# Patient Record
Sex: Male | Born: 1980 | Race: White | Hispanic: No | Marital: Married | Smoking: Former smoker
Health system: Southern US, Community
[De-identification: ages and names within clinical notes are randomized; demographics above are authoritative.]

## PROBLEM LIST (undated history)

## (undated) DIAGNOSIS — L409 Psoriasis, unspecified: Secondary | ICD-10-CM

---

## 1999-03-06 ENCOUNTER — Emergency Department (HOSPITAL_COMMUNITY): Admission: EM | Admit: 1999-03-06 | Discharge: 1999-03-06 | Payer: Self-pay | Admitting: Emergency Medicine

## 1999-03-07 ENCOUNTER — Encounter: Payer: Self-pay | Admitting: *Deleted

## 1999-03-07 ENCOUNTER — Emergency Department (HOSPITAL_COMMUNITY): Admission: EM | Admit: 1999-03-07 | Discharge: 1999-03-07 | Payer: Self-pay | Admitting: Emergency Medicine

## 1999-03-15 ENCOUNTER — Emergency Department (HOSPITAL_COMMUNITY): Admission: EM | Admit: 1999-03-15 | Discharge: 1999-03-15 | Payer: Self-pay | Admitting: Emergency Medicine

## 1999-10-09 ENCOUNTER — Emergency Department (HOSPITAL_COMMUNITY): Admission: EM | Admit: 1999-10-09 | Discharge: 1999-10-09 | Payer: Self-pay | Admitting: Emergency Medicine

## 2011-07-10 ENCOUNTER — Emergency Department: Payer: Self-pay | Admitting: Emergency Medicine

## 2013-07-14 IMAGING — CT CT HEAD WITHOUT CONTRAST
3 series · 18 of 30 positions shown, 20 images · non-contrast
Comparison: none

REASON FOR EXAM: HA
COMMENTS:

[Series 2: without · axial · non-contrast · 0.40mm/px · z∈[-136,-22]mm · 8 of 31 slices shown (1 of 2)]
[im 4/31  brain]
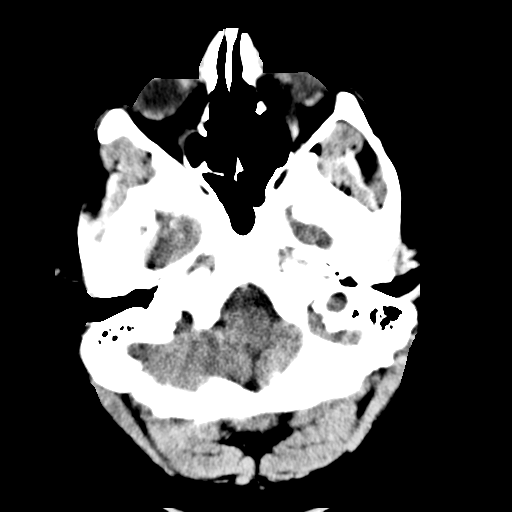
[im 7/31  brain]
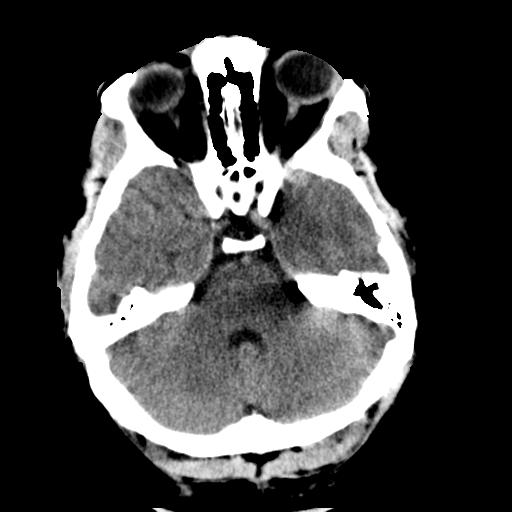
[im 11/31  brain]
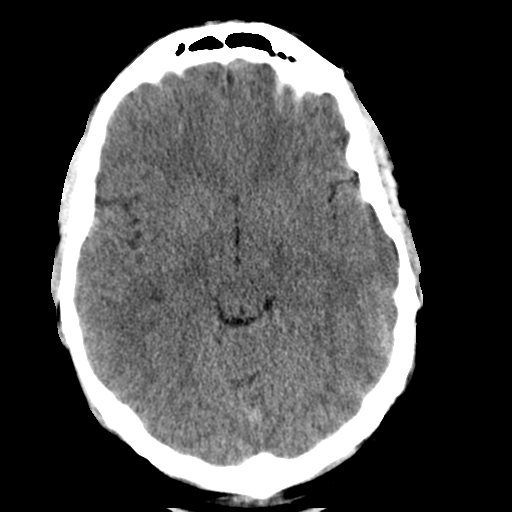
[im 14/31  brain]
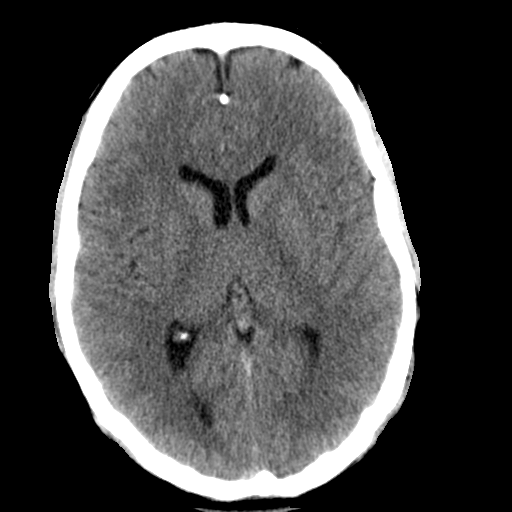
[im 17/31  brain]
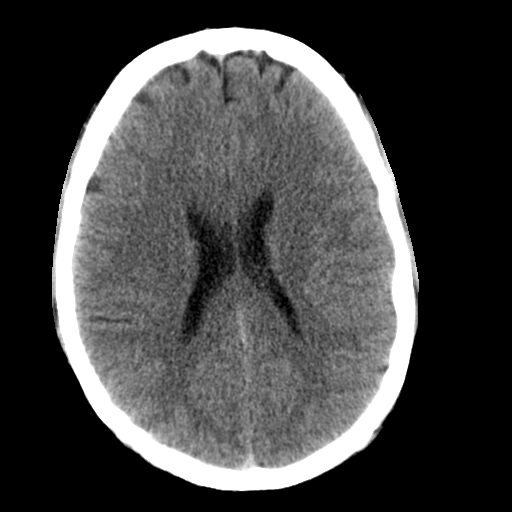
[im 21/31  brain]
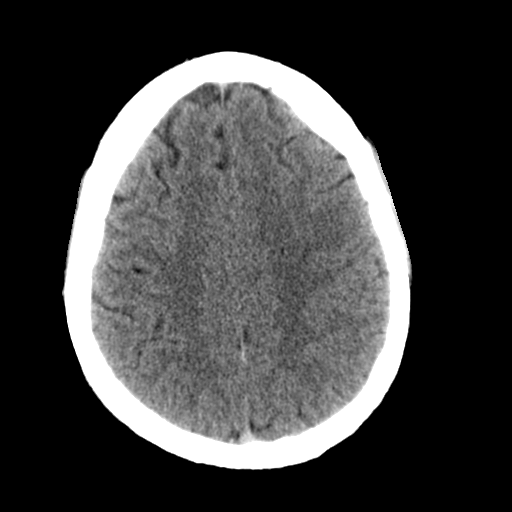
[im 24/31  brain]
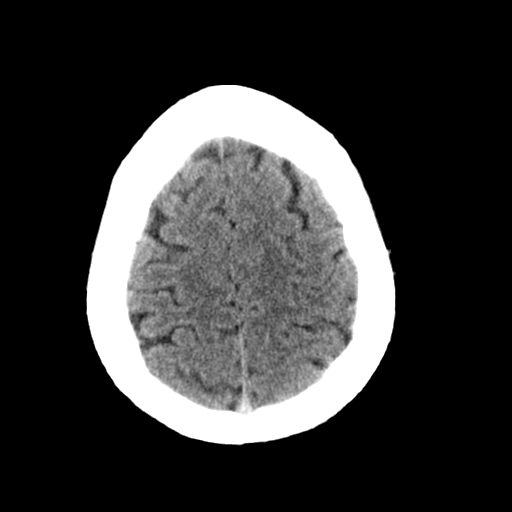
[im 27/31  brain]
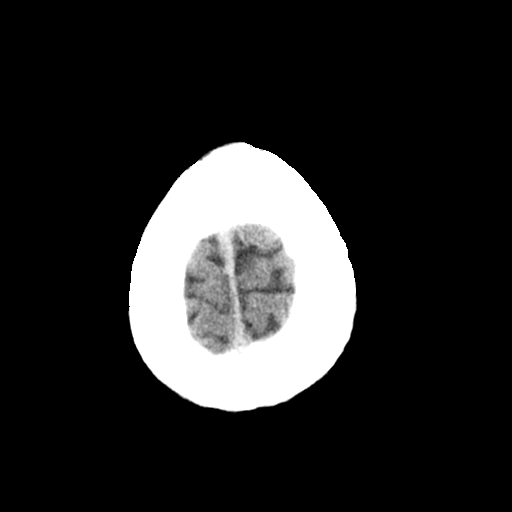

[Series 3: bone · axial · 0.40mm/px · z∈[-136,-122]mm · 2 of 31 slices shown]
[im 4/31  bone]
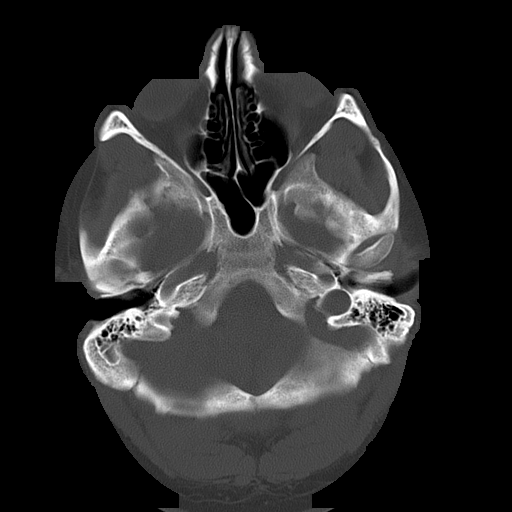
[im 7/31  bone]
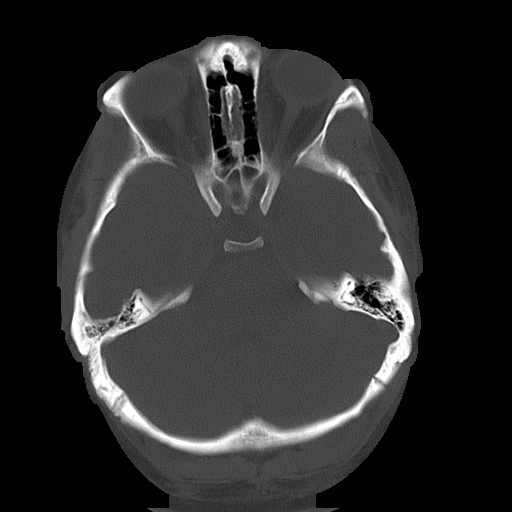

[Series 4: without · axial · non-contrast · 0.40mm/px · z∈[-161,-36]mm · 8 of 33 slices shown, 10 images (2 of 2)]
[im 4/33  brain]
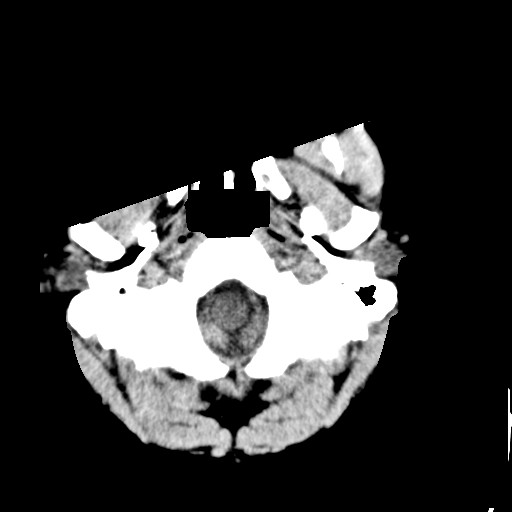
[im 4/33  bone]
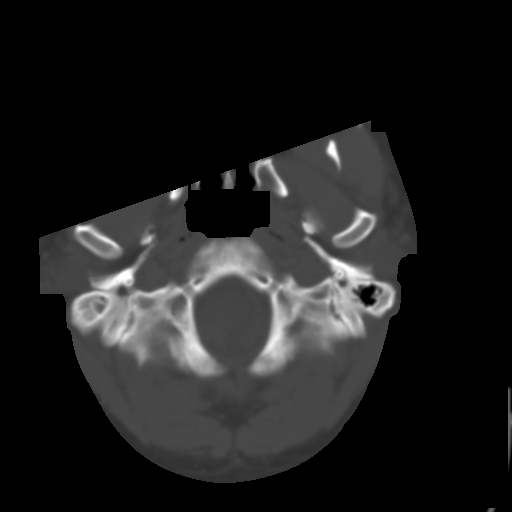
[im 8/33  brain]
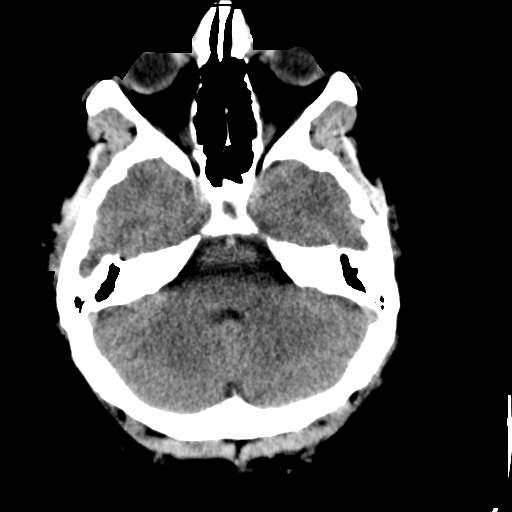
[im 11/33  brain]
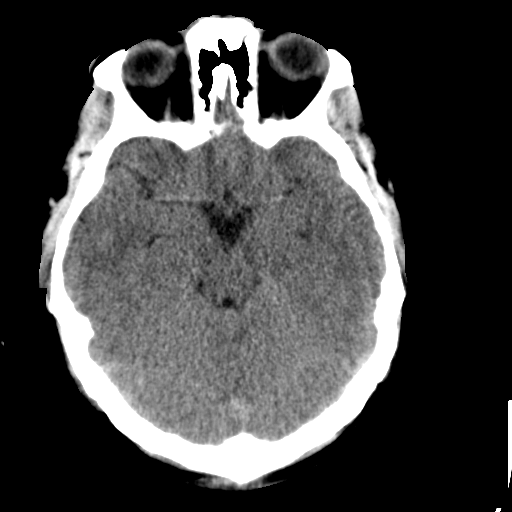
[im 15/33  brain]
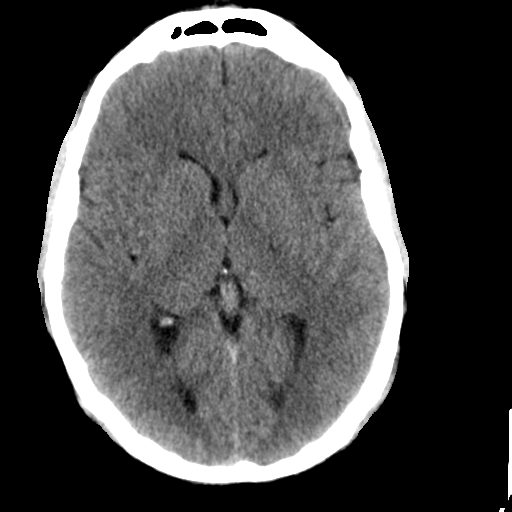
[im 18/33  brain]
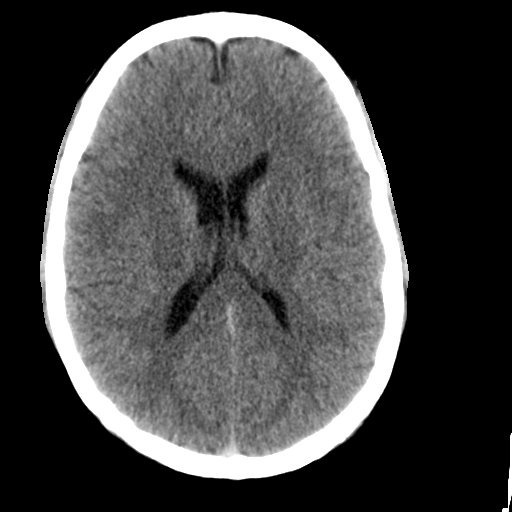
[im 18/33  bone]
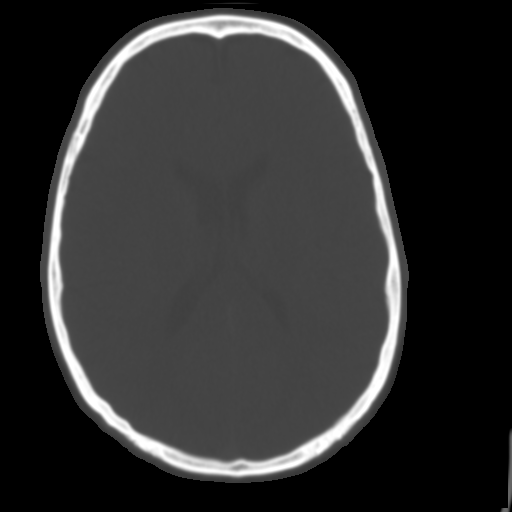
[im 22/33  brain]
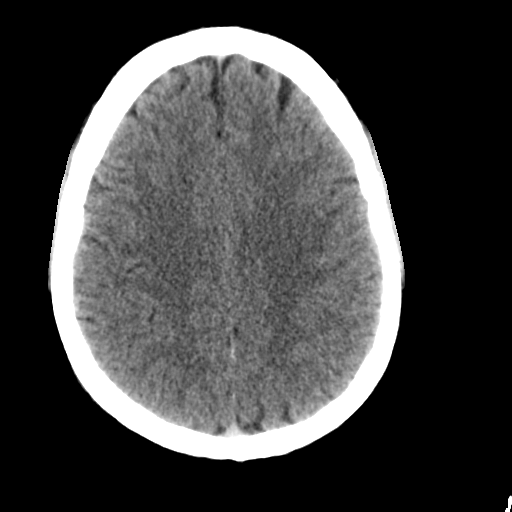
[im 25/33  brain]
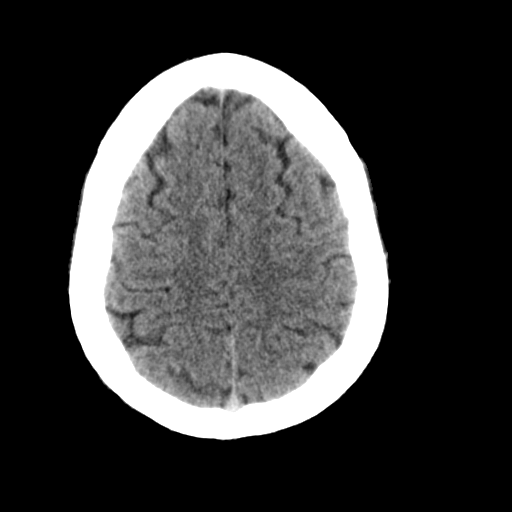
[im 29/33  brain]
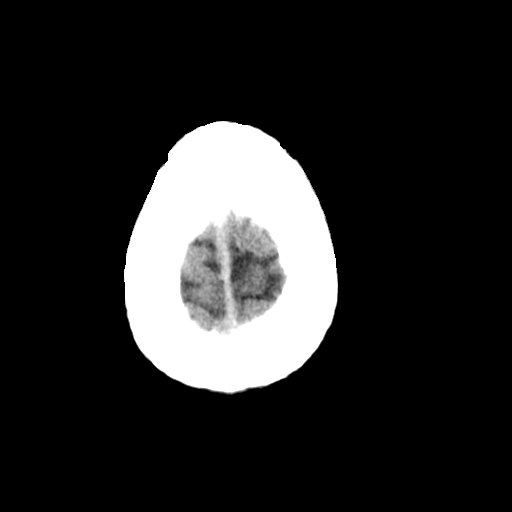

[18 of 30 positions shown; findings below may reference images not displayed]

PROCEDURE:     CT  - CT HEAD WITHOUT CONTRAST  - July 10, 2011  [DATE]

RESULT:     Axial noncontrast CT scanning was performed through the brain
with reconstructions at 5 mm intervals and slice thicknesses.

The ventricles are normal in size and position. There is no intracranial
hemorrhage nor intracranial mass effect. The  cerebellum and brainstem are
normal in density. There is no evidence of an evolving ischemic infarction.
At bone window settings there is no evidence of a skull fracture. The
observed portions of the paranasal sinuses and mastoid air cells are clear.
IMPRESSION: I see no acute intracranial abnormality.

## 2013-09-12 ENCOUNTER — Emergency Department: Payer: Self-pay | Admitting: Emergency Medicine

## 2013-09-12 LAB — URINALYSIS, COMPLETE
Bacteria: NONE SEEN
Bilirubin,UR: NEGATIVE
Blood: NEGATIVE
Glucose,UR: NEGATIVE mg/dL (ref 0–75)
Hyaline Cast: 6
Leukocyte Esterase: NEGATIVE
Nitrite: NEGATIVE
Ph: 6 (ref 4.5–8.0)
Protein: NEGATIVE
RBC,UR: <1 /HPF (ref 0–5)
Specific Gravity: 1.021 (ref 1.003–1.030)
Squamous Epithelial: NONE SEEN
WBC UR: 1 /HPF (ref 0–5)

## 2013-09-12 LAB — CBC WITH DIFFERENTIAL/PLATELET
Basophil #: 0 10*3/uL (ref 0.0–0.1)
Basophil %: 0.5 %
Eosinophil #: 0.1 10*3/uL (ref 0.0–0.7)
Eosinophil %: 1.7 %
HCT: 44 % (ref 40.0–52.0)
HGB: 15.2 g/dL (ref 13.0–18.0)
Lymphocyte #: 2.8 10*3/uL (ref 1.0–3.6)
Lymphocyte %: 34 %
MCH: 29.9 pg (ref 26.0–34.0)
MCHC: 34.5 g/dL (ref 32.0–36.0)
MCV: 87 fL (ref 80–100)
Monocyte #: 0.5 x10 3/mm (ref 0.2–1.0)
Monocyte %: 5.6 %
Neutrophil #: 4.8 10*3/uL (ref 1.4–6.5)
Neutrophil %: 58.2 %
Platelet: 118 10*3/uL — ABNORMAL LOW (ref 150–440)
RBC: 5.09 10*6/uL (ref 4.40–5.90)
RDW: 12.8 % (ref 11.5–14.5)
WBC: 8.3 10*3/uL (ref 3.8–10.6)

## 2013-09-12 LAB — COMPREHENSIVE METABOLIC PANEL
Albumin: 3.8 g/dL (ref 3.4–5.0)
Alkaline Phosphatase: 48 U/L
Anion Gap: 8 (ref 7–16)
BUN: 16 mg/dL (ref 7–18)
Bilirubin,Total: 0.6 mg/dL (ref 0.2–1.0)
Calcium, Total: 8.2 mg/dL — ABNORMAL LOW (ref 8.5–10.1)
Chloride: 108 mmol/L — ABNORMAL HIGH (ref 98–107)
Co2: 25 mmol/L (ref 21–32)
Creatinine: 1.39 mg/dL — ABNORMAL HIGH (ref 0.60–1.30)
EGFR (African American): >60
EGFR (Non-African Amer.): >60
Glucose: 122 mg/dL — ABNORMAL HIGH (ref 65–99)
Osmolality: 284 (ref 275–301)
Potassium: 3.2 mmol/L — ABNORMAL LOW (ref 3.5–5.1)
SGOT(AST): 31 U/L (ref 15–37)
SGPT (ALT): 31 U/L
Sodium: 141 mmol/L (ref 136–145)
Total Protein: 6.7 g/dL (ref 6.4–8.2)

## 2013-09-12 LAB — CK-MB: CK-MB: 1.6 ng/mL (ref 0.5–3.6)

## 2013-09-12 LAB — CK: CK, Total: 244 U/L

## 2013-09-12 LAB — MAGNESIUM: Magnesium: 1.6 mg/dL — ABNORMAL LOW

## 2013-09-12 LAB — TROPONIN I: Troponin-I: <0.02 ng/mL

## 2014-06-16 ENCOUNTER — Emergency Department
Admission: EM | Admit: 2014-06-16 | Discharge: 2014-06-16 | Disposition: A | Discharge status: Home / Self Care | Payer: Self-pay | Attending: Internal Medicine | Admitting: Internal Medicine

## 2014-06-16 ENCOUNTER — Encounter: Payer: Self-pay | Admitting: *Deleted

## 2014-06-16 DIAGNOSIS — L03115 Cellulitis of right lower limb: Secondary | ICD-10-CM | POA: Insufficient documentation

## 2014-06-16 DIAGNOSIS — L03119 Cellulitis of unspecified part of limb: Secondary | ICD-10-CM

## 2014-06-16 DIAGNOSIS — L02415 Cutaneous abscess of right lower limb: Secondary | ICD-10-CM | POA: Insufficient documentation

## 2014-06-16 DIAGNOSIS — L02416 Cutaneous abscess of left lower limb: Secondary | ICD-10-CM | POA: Insufficient documentation

## 2014-06-16 DIAGNOSIS — L03116 Cellulitis of left lower limb: Secondary | ICD-10-CM | POA: Insufficient documentation

## 2014-06-16 DIAGNOSIS — L02419 Cutaneous abscess of limb, unspecified: Secondary | ICD-10-CM

## 2014-06-16 DIAGNOSIS — Z720 Tobacco use: Secondary | ICD-10-CM | POA: Insufficient documentation

## 2014-06-16 DIAGNOSIS — L409 Psoriasis, unspecified: Secondary | ICD-10-CM | POA: Insufficient documentation

## 2014-06-16 HISTORY — DX: Psoriasis, unspecified: L40.9

## 2014-06-16 LAB — CBC WITH DIFFERENTIAL/PLATELET
Basophils Absolute: 0.1 10*3/uL (ref 0–0.1)
Basophils Relative: 1 %
Eosinophils Absolute: 0.2 10*3/uL (ref 0–0.7)
Eosinophils Relative: 2 %
HCT: 46.7 % (ref 40.0–52.0)
Hemoglobin: 16 g/dL (ref 13.0–18.0)
Lymphocytes Relative: 22 %
Lymphs Abs: 2.7 10*3/uL (ref 1.0–3.6)
MCH: 29.7 pg (ref 26.0–34.0)
MCHC: 34.3 g/dL (ref 32.0–36.0)
MCV: 86.5 fL (ref 80.0–100.0)
Monocytes Absolute: 1 10*3/uL (ref 0.2–1.0)
Monocytes Relative: 8 %
Neutro Abs: 8.2 10*3/uL — ABNORMAL HIGH (ref 1.4–6.5)
Neutrophils Relative %: 67 %
Platelets: 154 10*3/uL (ref 150–440)
RBC: 5.4 MIL/uL (ref 4.40–5.90)
RDW: 13 % (ref 11.5–14.5)
WBC: 12.3 10*3/uL — ABNORMAL HIGH (ref 3.8–10.6)

## 2014-06-16 LAB — BASIC METABOLIC PANEL
ANION GAP: 10 (ref 5–15)
BUN: 13 mg/dL (ref 6–20)
CALCIUM: 9.2 mg/dL (ref 8.9–10.3)
CHLORIDE: 103 mmol/L (ref 101–111)
CO2: 26 mmol/L (ref 22–32)
Creatinine, Ser: 1.04 mg/dL (ref 0.61–1.24)
GFR calc Af Amer: >60 mL/min (ref >60)
GFR calc non Af Amer: >60 mL/min (ref >60)
Glucose, Bld: 92 mg/dL (ref 65–99)
Potassium: 3.8 mmol/L (ref 3.5–5.1)
SODIUM: 139 mmol/L (ref 135–145)

## 2014-06-16 MED ORDER — PREDNISONE 10 MG (21) PO TBPK: 10.0000 mg | ORAL_TABLET | Freq: Every day | ORAL | Status: DC

## 2014-06-16 MED ORDER — CLOBETASOL PROPIONATE 0.05 % EX CREA: 1.0000 "application " | TOPICAL_CREAM | Freq: Two times a day (BID) | CUTANEOUS | Status: DC

## 2014-06-16 MED ORDER — DEXAMETHASONE SODIUM PHOSPHATE 10 MG/ML IJ SOLN
INTRAMUSCULAR | Status: AC
  Administered 2014-06-16: 10 mg via INTRAMUSCULAR
  Filled 2014-06-16: qty 1

## 2014-06-16 MED ORDER — SULFAMETHOXAZOLE-TRIMETHOPRIM 800-160 MG PO TABS: 1.0000 | ORAL_TABLET | Freq: Two times a day (BID) | ORAL | Status: DC

## 2014-06-16 MED ORDER — DEXAMETHASONE SODIUM PHOSPHATE 10 MG/ML IJ SOLN
10.0000 mg | Freq: Once | INTRAMUSCULAR | Status: AC
  Administered 2014-06-16: 10 mg via INTRAMUSCULAR

## 2014-06-16 NOTE — ED Provider Notes (Signed)
[facility] Emergency Department Provider Note ____________________________________________  Time seen: Approximately 5:55 PM  I have reviewed the triage vital signs and the nursing notes.   HISTORY  Chief Complaint Abscess    HPI Tyler Watts is a 34 y.o. male of bilateral leg sores for about 2 weeks. Patient stated he has a history of psoriasis has been refractory to Kenalog cream. Patient stated he started noticing redness with some lesions to the lower legs has increased the last 2 weeks. States the lesions of breaking out around the psoriasis plaques. Area is tender to palpation but there is no discharge. Patient rated the pain from his area 3/10. Patient denied any fever but states he notices a bowel movements on now loose.   Past Medical History  Diagnosis Date  . Psoriasis of scalp     There are no active problems to display for this patient.   No past surgical history on file.  Current Outpatient Rx  Name  Route  Sig  Dispense  Refill  . clobetasol cream (TEMOVATE) 0.05 %   Topical   Apply 1 application topically 2 (two) times daily.   30 g   0   . predniSONE (STERAPRED UNI-PAK 21 TAB) 10 MG (21) TBPK tablet   Oral   Take 1 tablet (10 mg total) by mouth daily. Taper dose for 6 days.   21 tablet   0   . sulfamethoxazole-trimethoprim (BACTRIM DS,SEPTRA DS) 800-160 MG per tablet   Oral   Take 1 tablet by mouth 2 (two) times daily.   20 tablet   0     Allergies Review of patient's allergies indicates no known allergies.  No family history on file.  Social History History  Substance Use Topics  . Smoking status: Current Every Day Smoker  . Smokeless tobacco: Not on file  . Alcohol Use: No    Review of Systems Constitutional: No fever/chills Eyes: No visual changes. ENT: No sore throat. Cardiovascular: Denies chest pain. Respiratory: Denies shortness of breath. Gastrointestinal: No abdominal pain.  No nausea, no  vomiting.  Patient stated without described as still loose diuretic but they're very loose.. Genitourinary: Negative for dysuria. Musculoskeletal: Negative for back pain. Skin: Diffuse arises patches with multiple erythematous papular lesions. Neurological: Negative for headaches, focal weakness or numbness.     10-point ROS otherwise negative.  ____________________________________________   PHYSICAL EXAM:  VITAL SIGNS: ED Triage Vitals  Enc Vitals Group     BP 06/16/14 1741 155/99 mmHg     Pulse Rate 06/16/14 1741 78     Resp --      Temp 06/16/14 1741 98.8 F (37.1 C)     Temp src --      SpO2 06/16/14 1741 96 %     Weight 06/16/14 1741 202 lb (91.627 kg)     Height 06/16/14 1741 5\' 6"  (1.676 m)     Head Cir --      Peak Flow --      Pain Score --      Pain Loc --      Pain Edu? --      Excl. in GC? --     Constitutional: Alert and oriented. Well appearing and in no acute distress. Appears anxious Eyes: Conjunctivae are normal. PERRL. EOMI. Head: Atraumatic. Nose: No congestion/rhinnorhea. Mouth/Throat: Mucous membranes are moist.  Oropharynx non-erythematous. Neck: No stridor.  Supple and nontender to palpation. Hematological/Lymphatic/Immunilogical: No cervical lymphadenopathy. Cardiovascular: Normal rate, regular rhythm. Grossly  normal heart sounds.  Good peripheral circulation. Respiratory: Normal respiratory effort.  No retractions. Lungs CTAB. Gastrointestinal: Soft and nontender. No distention. No abdominal bruits. No CVA tenderness. Musculoskeletal: No lower extremity tenderness nor edema.  No joint effusions. Neurologic:  Normal speech and language. No gross focal neurologic deficits are appreciated. Speech is normal. No gait instability. Skin:  Skin is warm, dry and intact. Dry scaly plaques with papular erythematous lesions. Psychiatric: Mood and affect are normal. Speech and behavior are normal.  ____________________________________________    LABS (all labs ordered are listed, but only abnormal results are displayed)  Labs Reviewed  CBC WITH DIFFERENTIAL/PLATELET - Abnormal; Notable for the following:    WBC 12.3 (*)    Neutro Abs 8.2 (*)    All other components within normal limits  BASIC METABOLIC PANEL   ____________________________________________  EKG   ____________________________________________  RADIOLOGY   ____________________________________________   PROCEDURES  Procedure(s) performed: None  Critical Care performed: No  ____________________________________________   INITIAL IMPRESSION / ASSESSMENT AND PLAN / ED COURSE  Pertinent labs & imaging results that were available during my care of the patient were reviewed by me and considered in my medical decision making (see chart for details).  Skin infection. ____________________________________________   FINAL CLINICAL IMPRESSION(S) / ED DIAGNOSES  Final diagnoses:  Cellulitis and abscess of leg  Psoriasis (a type of skin inflammation)       Joni Reiningonald K Smith, PA-C 06/16/14 1901  Sherlyn HaySheryl L Gottlieb, DO 06/16/14 2223

## 2014-06-16 NOTE — ED Notes (Signed)
   06/16/14 1835  Skin Color/Condition  Skin Color/Condition (WDL) X  Skin Integrity Rash (bilateral lower legs, red patchy dry skin noted with multiple areas of scabs)  Pt reports possible infection on bilateral lower legs. Pt has PMH that he reports seeing a dermatologist for same.

## 2014-06-16 NOTE — Discharge Instructions (Signed)
Take medications as directed and follow up with Dermatologist.

## 2017-07-15 ENCOUNTER — Emergency Department: Payer: Self-pay

## 2017-07-15 ENCOUNTER — Emergency Department
Admission: EM | Admit: 2017-07-15 | Discharge: 2017-07-16 | Disposition: A | Discharge status: Other Acute Inpatient Hospital | Payer: Self-pay | Attending: Emergency Medicine | Admitting: Emergency Medicine

## 2017-07-15 ENCOUNTER — Other Ambulatory Visit: Payer: Self-pay

## 2017-07-15 DIAGNOSIS — F17200 Nicotine dependence, unspecified, uncomplicated: Secondary | ICD-10-CM | POA: Insufficient documentation

## 2017-07-15 DIAGNOSIS — F29 Unspecified psychosis not due to a substance or known physiological condition: Secondary | ICD-10-CM | POA: Insufficient documentation

## 2017-07-15 DIAGNOSIS — Z046 Encounter for general psychiatric examination, requested by authority: Secondary | ICD-10-CM | POA: Insufficient documentation

## 2017-07-15 DIAGNOSIS — Z79899 Other long term (current) drug therapy: Secondary | ICD-10-CM | POA: Insufficient documentation

## 2017-07-15 LAB — COMPREHENSIVE METABOLIC PANEL
ALBUMIN: 4.8 g/dL (ref 3.5–5.0)
ALT: 20 U/L (ref 17–63)
AST: 20 U/L (ref 15–41)
Alkaline Phosphatase: 53 U/L (ref 38–126)
Anion gap: 10 (ref 5–15)
BUN: 12 mg/dL (ref 6–20)
CHLORIDE: 104 mmol/L (ref 101–111)
CO2: 23 mmol/L (ref 22–32)
Calcium: 9.3 mg/dL (ref 8.9–10.3)
Creatinine, Ser: 1 mg/dL (ref 0.61–1.24)
GFR calc Af Amer: >60 mL/min (ref >60)
GFR calc non Af Amer: >60 mL/min (ref >60)
GLUCOSE: 115 mg/dL — AB (ref 65–99)
POTASSIUM: 3.5 mmol/L (ref 3.5–5.1)
Sodium: 137 mmol/L (ref 135–145)
TOTAL PROTEIN: 7.8 g/dL (ref 6.5–8.1)
Total Bilirubin: 1.5 mg/dL — ABNORMAL HIGH (ref 0.3–1.2)

## 2017-07-15 LAB — ETHANOL

## 2017-07-15 LAB — CBC
HEMATOCRIT: 48.2 % (ref 40.0–52.0)
Hemoglobin: 16.7 g/dL (ref 13.0–18.0)
MCH: 29.2 pg (ref 26.0–34.0)
MCHC: 34.7 g/dL (ref 32.0–36.0)
MCV: 84.1 fL (ref 80.0–100.0)
Platelets: 195 10*3/uL (ref 150–440)
RBC: 5.73 MIL/uL (ref 4.40–5.90)
RDW: 13.3 % (ref 11.5–14.5)
WBC: 14.2 10*3/uL — ABNORMAL HIGH (ref 3.8–10.6)

## 2017-07-15 LAB — T4, FREE: FREE T4: 1.18 ng/dL (ref 0.82–1.77)

## 2017-07-15 LAB — GLUCOSE, CAPILLARY: Glucose-Capillary: 112 mg/dL — ABNORMAL HIGH (ref 65–99)

## 2017-07-15 LAB — CK: CK TOTAL: 98 U/L (ref 49–397)

## 2017-07-15 LAB — TSH: TSH: 2.969 u[IU]/mL (ref 0.350–4.500)

## 2017-07-15 NOTE — ED Triage Notes (Signed)
Pt presents to ED by EMS with altered mental status. Pt was at a church watching his sons 8th grade graduation and refused to leave when it was over because he wanted to be baptized. Pt family report he has been acting out of character for the past week. Pt father died due to brain tumor.

## 2017-07-15 NOTE — ED Provider Notes (Signed)
[facility] Emergency Department Provider Note   ____________________________________________   First MD Initiated Contact with Patient 07/15/17 2335     (approximate)  I have reviewed the triage vital signs and the nursing notes.   HISTORY  Chief Complaint Altered Mental Status  Level 5 caveat: Limited by bizarre behavior  HPI Tyler Watts is a 37 y.o. male brought to the ED by EMS from middle school graduation with a chief complaint of altered mental state.  Wife states patient has been acting erratic this week; quit his job of 20 years without any reason.  Tonight he was watching his son's eighth-grade graduation but refused to leave when it was over because he wanted to be baptized.  There was a clay structure on the wall and he thought that Jesus had moved it.  Patient had been in the ER lobby and received protocol labs and head scan but left.  His family member and the police brought him back.  Currently comes back to the treatment room in handcuffs.  Voices no medical complaints.  Denies SI/HI/AH/VH.  Denies recent fever, chills, chest pain, shortness of breath, abdominal pain, nausea, vomiting, diarrhea.  Denies recent travel or trauma.   Past Medical History:  Diagnosis Date  . Psoriasis of scalp     There are no active problems to display for this patient.   Prior to Admission medications   Medication Sig Start Date End Date Taking? Authorizing Provider  clobetasol cream (TEMOVATE) 0.05 % Apply 1 application topically 2 (two) times daily. 06/16/14   Joni Reining, PA-C  predniSONE (STERAPRED UNI-PAK 21 TAB) 10 MG (21) TBPK tablet Take 1 tablet (10 mg total) by mouth daily. Taper dose for 6 days. 06/16/14   Joni Reining, PA-C  sulfamethoxazole-trimethoprim (BACTRIM DS,SEPTRA DS) 800-160 MG per tablet Take 1 tablet by mouth 2 (two) times daily. 06/16/14   Joni Reining, PA-C    Allergies Patient has no known allergies.  No family history  on file.  Social History Social History   Tobacco Use  . Smoking status: Current Every Day Smoker  Substance Use Topics  . Alcohol use: No  . Drug use: Not on file    Review of Systems  Constitutional: No fever/chills Eyes: No visual changes. ENT: No sore throat. Cardiovascular: Denies chest pain. Respiratory: Denies shortness of breath. Gastrointestinal: No abdominal pain.  No nausea, no vomiting.  No diarrhea.  No constipation. Genitourinary: Negative for dysuria. Musculoskeletal: Negative for back pain. Skin: Negative for rash. Neurological: Negative for headaches, focal weakness or numbness. Psychiatric:Positive for erratic behavior.  ____________________________________________   PHYSICAL EXAM:  VITAL SIGNS: ED Triage Vitals [07/15/17 2159]  Enc Vitals Group     BP (!) 167/88     Pulse Rate 67     Resp 18     Temp 98.6 F (37 C)     Temp Source Oral     SpO2 95 %     Weight 205 lb (93 kg)     Height 5\' 6"  (1.676 m)     Head Circumference      Peak Flow      Pain Score 0     Pain Loc      Pain Edu?      Excl. in GC?     Constitutional: Alert and oriented.  Disheveled appearing and in no acute distress. Eyes: Conjunctivae are normal. PERRL. EOMI. Head: Atraumatic. Nose: No congestion/rhinnorhea. Mouth/Throat: Mucous membranes are moist.  Oropharynx non-erythematous. Neck: No stridor.  No cervical spine tenderness to palpation.  Supple neck without meningismus. Cardiovascular: Normal rate, regular rhythm. Grossly normal heart sounds.  Good peripheral circulation. Respiratory: Normal respiratory effort.  No retractions. Lungs CTAB. Gastrointestinal: Soft and nontender. No distention. No abdominal bruits. No CVA tenderness. Musculoskeletal: No lower extremity tenderness nor edema.  No joint effusions. Neurologic:  Normal speech and language. No gross focal neurologic deficits are appreciated. No gait instability. Skin:  Skin is warm, dry and intact.   Psoriatic plaques noted diffusely.   Psychiatric: Mood and affect are bizarre.  Smiling inappropriately.  Appears to be reacting to internal stimuli.  Speech and behavior are normal.  ____________________________________________   LABS (all labs ordered are listed, but only abnormal results are displayed)  Labs Reviewed  COMPREHENSIVE METABOLIC PANEL - Abnormal; Notable for the following components:      Result Value   Glucose, Bld 115 (*)    Total Bilirubin 1.5 (*)    All other components within normal limits  CBC - Abnormal; Notable for the following components:   WBC 14.2 (*)    All other components within normal limits  GLUCOSE, CAPILLARY - Abnormal; Notable for the following components:   Glucose-Capillary 112 (*)    All other components within normal limits  ACETAMINOPHEN LEVEL - Abnormal; Notable for the following components:   Acetaminophen (Tylenol), Serum <10 (*)    All other components within normal limits  ETHANOL  CK  TSH  T4, FREE  AMMONIA  LACTIC ACID, PLASMA  SALICYLATE LEVEL  URINALYSIS, COMPLETE (UACMP) WITH MICROSCOPIC  URINE DRUG SCREEN, QUALITATIVE ([facility] ONLY)  LACTIC ACID, PLASMA  GLUCOSE, CAPILLARY   ____________________________________________  EKG  ED ECG REPORT I, Lennin Osmond J, the attending physician, personally viewed and interpreted this ECG.   Date: 07/15/2017  EKG Time: 2205  Rate: 71  Rhythm: normal EKG, normal sinus rhythm  Axis: Normal  Intervals:none  ST&T Change: Nonspecific  ____________________________________________  RADIOLOGY  ED MD interpretation: No ICH  Official radiology report(s): Ct Head Wo Contrast  Result Date: 07/15/2017 CLINICAL DATA:  Altered mental status, change in behavior for 1 week. Family history of brain tumor. EXAM: CT HEAD WITHOUT CONTRAST TECHNIQUE: Contiguous axial images were obtained from the base of the skull through the vertex without intravenous contrast. COMPARISON:  CT HEAD July 10, 2011  FINDINGS: BRAIN: No intraparenchymal hemorrhage, mass effect nor midline shift. The ventricles and sulci are normal. No acute large vascular territory infarcts. No abnormal extra-axial fluid collections. Basal cisterns are patent. VASCULAR: Unremarkable. SKULL/SOFT TISSUES: No skull fracture. No significant soft tissue swelling. ORBITS/SINUSES: The included ocular globes and orbital contents are normal.The mastoid aircells and included paranasal sinuses are well-aerated. OTHER: None. IMPRESSION: Normal noncontrast CT HEAD. Electronically Signed   By: Awilda Metroourtnay  Bloomer M.D.   On: 07/15/2017 22:36    ____________________________________________   PROCEDURES  Procedure(s) performed: None  Procedures  Critical Care performed: No  ____________________________________________   INITIAL IMPRESSION / ASSESSMENT AND PLAN / ED COURSE  As part of my medical decision making, I reviewed the following data within the electronic MEDICAL RECORD NUMBER History obtained from family, Nursing notes reviewed and incorporated, Labs reviewed, EKG interpreted, Old chart reviewed, Radiograph reviewed, A consult was requested and obtained from this/these consultant(s) Psychiatry and Notes from prior ED visits   37 year old healthy male who presents with erratic behavior, escalating over the past week.  Differential diagnosis includes but is not limited to intracranial hemorrhage, CVA, infectious, metabolic,  psychiatric etiologies, etc.   Patient had protocol lab work and CT scan while awaiting treatment room.  I have personally reviewed these which are unremarkable.  I have added acetaminophen and salicylate.  Awaiting urine sample for urine drug screen.  Given that patient is a flight risk and has already left the premises once, will place him under involuntary commitment for his safety.  Will consult TTS and [facility] psychiatry to evaluate.  Clinical Course as of Jul 16 321  Tue Jul 16, 2017  0321 Patient was evaluated  by [facility] psychiatry who recommends inpatient hospitalization and maintaining IVC.  Recommendations for as needed Haldol and Ativan given.  Patient is currently medically cleared for psychiatric disposition.   [JS]    Clinical Course User Index [JS] Irean Hong, MD     ____________________________________________   FINAL CLINICAL IMPRESSION(S) / ED DIAGNOSES  Final diagnoses:  Psychosis, unspecified psychosis type [facility])     ED Discharge Orders    None       Note:  This document was prepared using Dragon voice recognition software and may include unintentional dictation errors.    Irean Hong, MD 07/16/17 [phone]
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
## 2017-07-16 ENCOUNTER — Inpatient Hospital Stay
Admission: AD | Admit: 2017-07-16 | Discharge: 2017-07-22 | DRG: 885 | Disposition: A | Discharge status: Home / Self Care | Payer: No Typology Code available for payment source | Attending: Psychiatry | Admitting: Psychiatry

## 2017-07-16 ENCOUNTER — Encounter: Payer: Self-pay | Admitting: Psychiatry

## 2017-07-16 ENCOUNTER — Other Ambulatory Visit: Payer: Self-pay

## 2017-07-16 DIAGNOSIS — F312 Bipolar disorder, current episode manic severe with psychotic features: Secondary | ICD-10-CM | POA: Diagnosis present

## 2017-07-16 DIAGNOSIS — L409 Psoriasis, unspecified: Secondary | ICD-10-CM | POA: Diagnosis present

## 2017-07-16 DIAGNOSIS — Z87891 Personal history of nicotine dependence: Secondary | ICD-10-CM | POA: Diagnosis not present

## 2017-07-16 DIAGNOSIS — F410 Panic disorder [episodic paroxysmal anxiety] without agoraphobia: Secondary | ICD-10-CM | POA: Diagnosis present

## 2017-07-16 LAB — URINE DRUG SCREEN, QUALITATIVE (ARMC ONLY)
Amphetamines, Ur Screen: NOT DETECTED
Barbiturates, Ur Screen: NOT DETECTED
Benzodiazepine, Ur Scrn: POSITIVE — AB
Cannabinoid 50 Ng, Ur ~~LOC~~: NOT DETECTED
Cocaine Metabolite,Ur ~~LOC~~: NOT DETECTED
MDMA (Ecstasy)Ur Screen: NOT DETECTED
Methadone Scn, Ur: NOT DETECTED
Opiate, Ur Screen: NOT DETECTED
Phencyclidine (PCP) Ur S: NOT DETECTED
Tricyclic, Ur Screen: NOT DETECTED

## 2017-07-16 LAB — LACTIC ACID, PLASMA: Lactic Acid, Venous: 0.9 mmol/L (ref 0.5–1.9)

## 2017-07-16 LAB — AMMONIA: AMMONIA: 32 umol/L (ref 9–35)

## 2017-07-16 LAB — URINALYSIS, COMPLETE (UACMP) WITH MICROSCOPIC
BACTERIA UA: NONE SEEN
Bilirubin Urine: NEGATIVE
Glucose, UA: NEGATIVE mg/dL
Hgb urine dipstick: NEGATIVE
KETONES UR: 20 mg/dL — AB
Leukocytes, UA: NEGATIVE
Nitrite: NEGATIVE
PH: 6 (ref 5.0–8.0)
Protein, ur: NEGATIVE mg/dL
Specific Gravity, Urine: 1.026 (ref 1.005–1.030)
Squamous Epithelial / LPF: NONE SEEN (ref 0–5)

## 2017-07-16 LAB — ACETAMINOPHEN LEVEL: Acetaminophen (Tylenol), Serum: <10 ug/mL — ABNORMAL LOW (ref 10–30)

## 2017-07-16 LAB — SALICYLATE LEVEL: Salicylate Lvl: <7 mg/dL (ref 2.8–30.0)

## 2017-07-16 MED ORDER — TRAZODONE HCL 100 MG PO TABS
100.0000 mg | ORAL_TABLET | Freq: Every day | ORAL | Status: DC
  Administered 2017-07-16 – 2017-07-17 (×2): 100 mg via ORAL
  Filled 2017-07-16 (×2): qty 1

## 2017-07-16 MED ORDER — RISPERIDONE 1 MG PO TABS
2.0000 mg | ORAL_TABLET | Freq: Two times a day (BID) | ORAL | Status: DC
  Administered 2017-07-16 – 2017-07-17 (×2): 2 mg via ORAL
  Filled 2017-07-16 (×2): qty 2

## 2017-07-16 MED ORDER — LORAZEPAM 2 MG PO TABS
2.0000 mg | ORAL_TABLET | Freq: Four times a day (QID) | ORAL | Status: DC | PRN
  Administered 2017-07-16: 2 mg via ORAL
  Filled 2017-07-16: qty 1

## 2017-07-16 MED ORDER — HALOPERIDOL 5 MG PO TABS
5.0000 mg | ORAL_TABLET | Freq: Four times a day (QID) | ORAL | Status: DC | PRN
  Administered 2017-07-16: 5 mg via ORAL
  Filled 2017-07-16: qty 1

## 2017-07-16 MED ORDER — ACETAMINOPHEN 325 MG PO TABS
650.0000 mg | ORAL_TABLET | Freq: Four times a day (QID) | ORAL | Status: DC | PRN
  Administered 2017-07-18: 650 mg via ORAL
  Filled 2017-07-16: qty 2

## 2017-07-16 MED ORDER — DIVALPROEX SODIUM 500 MG PO DR TAB
500.0000 mg | DELAYED_RELEASE_TABLET | Freq: Three times a day (TID) | ORAL | Status: DC
  Administered 2017-07-16 – 2017-07-19 (×9): 500 mg via ORAL
  Filled 2017-07-16 (×8): qty 1

## 2017-07-16 MED ORDER — MAGNESIUM HYDROXIDE 400 MG/5ML PO SUSP: 30.0000 mL | Freq: Every day | ORAL | Status: DC | PRN

## 2017-07-16 MED ORDER — ALUM & MAG HYDROXIDE-SIMETH 200-200-20 MG/5ML PO SUSP: 30.0000 mL | ORAL | Status: DC | PRN

## 2017-07-16 NOTE — ED Notes (Signed)
Pt took medication without difficulty. Pt asked about rash on torso, when pt was asked if he has hx of "psoriasis or eczema" pt states "I've been told it's psoriasis." Pt states he has had the rash for "many years."

## 2017-07-16 NOTE — ED Notes (Signed)
Pt has blotches hives all over his body. He states they have been there for awhile. On his legs are rive hive like blisters. Pt states they are itchy. Pt easily changed into maroon scrubs and allowed nurse to take vital signs.

## 2017-07-16 NOTE — ED Notes (Signed)
Patient having to be redirected by [place] police to stay in room. Patient requesting to leave. Officer explaining to patient that he has been IVC and is unable to leave at this time. Patient went back into room.

## 2017-07-16 NOTE — [facility] Suicide Risk Assessment (Signed)
[facility]   Nursing information obtained from:    Demographic factors:    Current Mental Status:    Loss Factors:    Historical Factors:    Risk Reduction Factors:     Total Time spent with patient: 1 hour Principal Problem: Bipolar I disorder, current or most recent episode manic, with psychotic features [facility](HCC) Diagnosis:   Patient Active Problem List   Diagnosis Date Noted  . Bipolar I disorder, current or most recent episode manic, with psychotic features (HCC) [F31.2] 07/16/2017    Priority: High   Subjective Data: psychotic break  Continued Clinical Symptoms:    The "Alcohol Use Disorders Identification Test", Guidelines for Use in Primary Care, Second Edition.  World Science writerHealth Organization [facility](WHO). Score between 0-7:  no or low risk or alcohol related problems. Score between 8-15:  moderate risk of alcohol related problems. Score between 16-19:  high risk of alcohol related problems. Score 20 or above:  warrants further diagnostic evaluation for alcohol dependence and treatment.   CLINICAL FACTORS:   Bipolar Disorder:   Mixed State Currently Psychotic   Musculoskeletal: Strength & Muscle Tone: within normal limits Gait & Station: normal Patient leans: N/A  Psychiatric Specialty Exam: Physical Exam  Nursing note and vitals reviewed. Psychiatric: His speech is normal. His mood appears anxious. His affect is inappropriate. He is actively hallucinating. Thought content is paranoid and delusional. Cognition and memory are normal. He expresses impulsivity.    Review of Systems  Neurological: Negative.   Psychiatric/Behavioral: Positive for hallucinations. The patient has insomnia.   All other systems reviewed and are negative.   There were no vitals taken for this visit.There is no height or weight on file to calculate BMI.  General Appearance: Casual  Eye Contact:  Good  Speech:  Clear and Coherent  Volume:  Normal  Mood:  Euphoric   Affect:  Congruent  Thought Process:  Disorganized, Irrelevant and Descriptions of Associations: Tangential  Orientation:  Full (Time, Place, and Person)  Thought Content:  Illogical, Delusions, Hallucinations: Auditory and Paranoid Ideation  Suicidal Thoughts:  No  Homicidal Thoughts:  No  Memory:  Immediate;   Fair Recent;   Fair Remote;   Fair  Judgement:  Poor  Insight:  Lacking  Psychomotor Activity:  Increased  Concentration:  Concentration: Fair and Attention Span: Fair  Recall:  [employer]  Fund of Knowledge:  Fair  Language:  Fair  Akathisia:  No  Handed:  Right  AIMS (if indicated):     Assets:  Communication Skills Desire for Improvement Financial Resources/Insurance Housing Physical Health Resilience Social Support Transportation Vocational/Educational  ADL's:  Intact  Cognition:  WNL  Sleep:         COGNITIVE FEATURES THAT CONTRIBUTE TO RISK:  None    SUICIDE RISK:   Minimal: No identifiable suicidal ideation.  Patients presenting with no risk factors but with morbid ruminations; may be classified as minimal risk based on the severity of the depressive symptoms  PLAN OF CARE: hospital admission, medication management, discharge planning.  Mr. Abbe AmsterdamHopkins is a 37 year old male with no past psychiatric history admitted for a manic episode.  #Psychosis -start Depakote 500 mg BID -start Risperdal 2 mg BID  #Insomnia -Trazodone 100 mg nightly  #Labs -lipid panel, TSH and A1C -EKG  #Disposition -discharge to home with family -follow up with [facility]  I certify that inpatient services furnished can reasonably be expected to improve the patient's condition.   Kristine LineaJolanta Greenly Rarick, MD 07/16/2017, 5:42  PM

## 2017-07-16 NOTE — ED Notes (Signed)
Only belongings are clothes. Wife took everything else home. 1 pair shoes, 1 pant, 1 belt, 1 underwear and 1 shirt

## 2017-07-16 NOTE — Tx Team (Signed)
Received Tyler Watts from the ED alert and oriented x4. He is cooperative with the admission  Process. He verbalized being at his son's graduation and refused to leave until he was baptized. He was removed with help and escorted to the hospital. He could not given a reason for his actions. The skin assessment was completed with nurse Isaiah BlakesJerrie. He was oriented to his new environment and dinner was ordered for him. He received relatives during visiting hours.

## 2017-07-16 NOTE — H&P (Signed)
Psychiatric Admission Assessment Adult  Patient Identification: Tyler Watts MRN:  [id number] Date of Evaluation:  07/16/2017 Chief Complaint:  Psychosis Principal Diagnosis: Bipolar I disorder, current or most recent episode manic, with psychotic features ([place]) Diagnosis:   Patient Active Problem List   Diagnosis Date Noted  . Bipolar I disorder, current or most recent episode manic, with psychotic features ([place]) [F31.2] 07/16/2017    Priority: High   History of Present Illness:   Identifying data. Mr. Tyler Watts is a 37 year old male with no past psychiatric history.  Chief complaint. "I have not been living up to the standards."  History of present illness. Information was obtained from the patient and the chart. The patient was brought to the ER by his uncle. The patient started behaving strangely for the past week or so. He became preoccupied with religion and started going to church. He quit his job of 20 years for no reason. Stopped sleeping. Felt very guilty about his "life style" but unable to point out what was wrong or improper. This all culminated during 8th grade graduation ceremony of his son on Sunday held in church when he refused to leave the church and insisted to be baptized. He also reports hearing voices, telling him what is inappropriate. He feels paranoid and has had two panic attacks. He denies alcohol or substance use but was positive for benzodiazepine on admission.  During the interview, the patient is attending to internal stimuli which he calls his "conscious". He is giggling inappropriately and his mood is too good for the situation. He has racing thoughts and difficulties answering questions. Speech in not loud or pressured.  Past psychiatric history. None.  Family psychiatric history. None.  Social history. Lives with his wife and 3 children ages 43, 82 and 57. Just quit a job at [employer]. The owner will take him back but the patient wants  change".  Total Time spent with patient: 1 hour  Is the patient at risk to self? No.  Has the patient been a risk to self in the past 6 months? No.  Has the patient been a risk to self within the distant past? No.  Is the patient a risk to others? No.  Has the patient been a risk to others in the past 6 months? No.  Has the patient been a risk to others within the distant past? No.   Prior Inpatient Therapy:   Prior Outpatient Therapy:    Alcohol Screening:   Substance Abuse History in the last 12 months:  No. Consequences of Substance Abuse: NA Previous Psychotropic Medications: No  Psychological Evaluations: No  Past Medical History:  Past Medical History:  Diagnosis Date  . Psoriasis of scalp    History reviewed. No pertinent surgical history. Family History: History reviewed. No pertinent family history.  Tobacco Screening:   Social History:  Social History   Substance and Sexual Activity  Alcohol Use No     Social History   Substance and Sexual Activity  Drug Use Not on file    Additional Social History:                           Allergies:  No Known Allergies Lab Results:  Results for orders placed or performed during the hospital encounter of 07/15/17 (from the past 48 hour(s))  Urine Drug Screen, Qualitative     Status: Abnormal   Collection Time: 07/15/17  2:54 PM  Result Value  Ref Range   Tricyclic, Ur Screen NONE DETECTED NONE DETECTED   Amphetamines, Ur Screen NONE DETECTED NONE DETECTED   MDMA (Ecstasy)Ur Screen NONE DETECTED NONE DETECTED   Cocaine Metabolite,Ur [place] NONE DETECTED NONE DETECTED   Opiate, Ur Screen NONE DETECTED NONE DETECTED   Phencyclidine (PCP) Ur S NONE DETECTED NONE DETECTED   Cannabinoid 50 Ng, Ur [place] NONE DETECTED NONE DETECTED   Barbiturates, Ur Screen NONE DETECTED NONE DETECTED   Benzodiazepine, Ur Scrn POSITIVE (A) NONE DETECTED   Methadone Scn, Ur NONE DETECTED NONE DETECTED    Comment: (NOTE) Tricyclics +  metabolites, urine    Cutoff 1000 ng/mL Amphetamines + metabolites, urine  Cutoff 1000 ng/mL MDMA (Ecstasy), urine              Cutoff 500 ng/mL Cocaine Metabolite, urine          Cutoff 300 ng/mL Opiate + metabolites, urine        Cutoff 300 ng/mL Phencyclidine (PCP), urine         Cutoff 25 ng/mL Cannabinoid, urine                 Cutoff 50 ng/mL Barbiturates + metabolites, urine  Cutoff 200 ng/mL Benzodiazepine, urine              Cutoff 200 ng/mL Methadone, urine                   Cutoff 300 ng/mL The urine drug screen provides only a preliminary, unconfirmed analytical test result and should not be used for non-medical purposes. Clinical consideration and professional judgment should be applied to any positive drug screen result due to possible interfering substances. A more specific alternate chemical method must be used in order to obtain a confirmed analytical result. Gas chromatography / mass spectrometry (GC/MS) is the preferred confirmat ory method. Performed at [facility], [address] [place], [place] [address]   Glucose, capillary     Status: Abnormal   Collection Time: 07/15/17 10:03 PM  Result Value Ref Range   Glucose-Capillary 112 (H) 65 - 99 mg/dL  Urinalysis, Complete w Microscopic     Status: Abnormal   Collection Time: 07/15/17 10:05 PM  Result Value Ref Range   Color, Urine YELLOW (A) YELLOW   APPearance CLEAR (A) CLEAR   Specific Gravity, Urine 1.026 1.005 - 1.030   pH 6.0 5.0 - 8.0   Glucose, UA NEGATIVE NEGATIVE mg/dL   Hgb urine dipstick NEGATIVE NEGATIVE   Bilirubin Urine NEGATIVE NEGATIVE   Ketones, ur 20 (A) NEGATIVE mg/dL   Protein, ur NEGATIVE NEGATIVE mg/dL   Nitrite NEGATIVE NEGATIVE   Leukocytes, UA NEGATIVE NEGATIVE   RBC / HPF 0-5 0 - 5 RBC/hpf   WBC, UA 0-5 0 - 5 WBC/hpf   Bacteria, UA NONE SEEN NONE SEEN   Squamous Epithelial / LPF NONE SEEN 0 - 5   Mucus PRESENT     Comment: Performed at [facility], [address] [place], [place] [address]  Comprehensive metabolic panel     Status: Abnormal   Collection Time: 07/15/17 10:06 PM  Result Value Ref Range   Sodium 137 135 - 145 mmol/L   Potassium 3.5 3.5 - 5.1 mmol/L   Chloride 104 101 - 111 mmol/L   CO2 23 22 - 32 mmol/L   Glucose, Bld 115 (H) 65 - 99 mg/dL   BUN 12 6 - 20 mg/dL   Creatinine, Ser 1.00 0.61 - 1.24  mg/dL   Calcium 9.3 8.9 - 10.3 mg/dL   Total Protein 7.8 6.5 - 8.1 g/dL   Albumin 4.8 3.5 - 5.0 g/dL   AST 20 15 - 41 U/L   ALT 20 17 - 63 U/L   Alkaline Phosphatase 53 38 - 126 U/L   Total Bilirubin 1.5 (H) 0.3 - 1.2 mg/dL   GFR calc non Af Amer >60 >60 mL/min   GFR calc Af Amer >60 >60 mL/min    Comment: (NOTE) The eGFR has been calculated using the CKD EPI equation. This calculation has not been validated in all clinical situations. eGFR's persistently <60 mL/min signify possible Chronic Kidney Disease.    Anion gap 10 5 - 15    Comment: Performed at [facility], [address] [place], [place] [address]  CBC     Status: Abnormal   Collection Time: 07/15/17 10:06 PM  Result Value Ref Range   WBC 14.2 (H) 3.8 - 10.6 K/uL   RBC 5.73 4.40 - 5.90 MIL/uL   Hemoglobin 16.7 13.0 - 18.0 g/dL   HCT 48.2 40.0 - 52.0 %   MCV 84.1 80.0 - 100.0 fL   MCH 29.2 26.0 - 34.0 pg   MCHC 34.7 32.0 - 36.0 g/dL   RDW 13.3 11.5 - 14.5 %   Platelets 195 150 - 440 K/uL    Comment: Performed at [facility], [address] [place], [place] [address]  Ethanol     Status: None   Collection Time: 07/15/17 10:06 PM  Result Value Ref Range   Alcohol, Ethyl (B) <10 <10 mg/dL    Comment: (NOTE) Lowest detectable limit for serum alcohol is 10 mg/dL. For medical purposes only. Performed at [facility], [address] [place], [place] [address]   CK     Status: None   Collection Time: 07/15/17 10:06 PM  Result Value Ref Range   Total CK 98 49 - 397 U/L    Comment: Performed at [facility],  [address] [place], [place] [address]  TSH     Status: None   Collection Time: 07/15/17 10:06 PM  Result Value Ref Range   TSH 2.969 0.350 - 4.500 uIU/mL    Comment: Performed by a 3rd Generation assay with a functional sensitivity of <=0.01 uIU/mL. Performed at [facility], [address] [place], [place] [address]   T4, free     Status: None   Collection Time: 07/15/17 10:06 PM  Result Value Ref Range   Free T4 1.18 0.82 - 1.77 ng/dL    Comment: (NOTE) Biotin ingestion may interfere with free T4 tests. If the results are inconsistent with the TSH level, previous test results, or the clinical presentation, then consider biotin interference. If needed, order repeat testing after stopping biotin. Performed at [facility], [address] [place], [place] [address]   Acetaminophen level     Status: Abnormal   Collection Time: 07/15/17 10:06 PM  Result Value Ref Range   Acetaminophen (Tylenol), Serum <10 (L) 10 - 30 ug/mL    Comment: (NOTE) Therapeutic concentrations vary significantly. A range of 10-30 ug/mL  may be an effective concentration for many patients. However, some  are best treated at concentrations outside of this range. Acetaminophen concentrations >150 ug/mL at 4 hours after ingestion  and >50 ug/mL at 12 hours after ingestion are often associated with  toxic reactions. Performed at [facility], [address], [place], [place] [address]  Salicylate level     Status: None   Collection Time: 07/15/17 10:06 PM  Result Value Ref Range   Salicylate Lvl <1.2 2.8 - 30.0 mg/dL    Comment: Performed at [facility], [address] [place], [place] [address]  Ammonia     Status: None   Collection Time: 07/16/17  1:17 AM  Result Value Ref Range   Ammonia 32 9 - 35 umol/L    Comment: Performed at [facility], [address] [place], [place] [address]  Lactic acid, plasma     Status: None   Collection Time:  07/16/17  1:17 AM  Result Value Ref Range   Lactic Acid, Venous 0.9 0.5 - 1.9 mmol/L    Comment: Performed at [facility], [address] [place], [place] [address]    Blood Alcohol level:  Lab Results  Component Value Date   [facility] <10 15/83/0940    Metabolic Disorder Labs:  No results found for: HGBA1C, MPG No results found for: PROLACTIN No results found for: CHOL, TRIG, HDL, CHOLHDL, VLDL, LDLCALC  Current Medications: Current Facility-Administered Medications  Medication Dose Route Frequency Provider Last Rate Last Dose  . acetaminophen (TYLENOL) tablet 650 mg  650 mg Oral Q6H PRN Carolyne Whitsel B, MD      . alum & mag hydroxide-simeth (MAALOX/MYLANTA) 200-200-20 MG/5ML suspension 30 mL  30 mL Oral Q4H PRN Mackey Varricchio B, MD      . divalproex (DEPAKOTE) DR tablet 500 mg  500 mg Oral Q8H Sopheap Basic B, MD      . magnesium hydroxide (MILK OF MAGNESIA) suspension 30 mL  30 mL Oral Daily PRN Muath Hallam B, MD      . risperiDONE (RISPERDAL) tablet 2 mg  2 mg Oral BID Geniece Akers B, MD      . traZODone (DESYREL) tablet 100 mg  100 mg Oral QHS Bayley Hurn B, MD       PTA Medications: Medications Prior to Admission  Medication Sig Dispense Refill Last Dose  . clobetasol cream (TEMOVATE) 7.68 % Apply 1 application topically 2 (two) times daily. (Patient not taking: Reported on 07/16/2017) 30 g 0 Not Taking at Unknown time  . predniSONE (STERAPRED UNI-PAK 21 TAB) 10 MG (21) TBPK tablet Take 1 tablet (10 mg total) by mouth daily. Taper dose for 6 days. (Patient not taking: Reported on 07/16/2017) 21 tablet 0 Completed Course at Unknown time  . sulfamethoxazole-trimethoprim (BACTRIM DS,SEPTRA DS) 800-160 MG per tablet Take 1 tablet by mouth 2 (two) times daily. (Patient not taking: Reported on 07/16/2017) 20 tablet 0 Completed Course at Unknown time    Musculoskeletal: Strength & Muscle Tone: within normal limits Gait & Station:  normal Patient leans: N/A  Psychiatric Specialty Exam: I reviewed physical examination performed in the ER and agree with the findings. Physical Exam  Nursing note and vitals reviewed. Psychiatric: His speech is normal. His affect is labile and inappropriate. He is actively hallucinating. Thought content is paranoid and delusional. Cognition and memory are impaired. He expresses impulsivity.    Review of Systems  Neurological: Negative.   Psychiatric/Behavioral: Positive for hallucinations. The patient has insomnia.   All other systems reviewed and are negative.   There were no vitals taken for this visit.There is no height or weight on file to calculate BMI.  See SRA  Sleep:       Treatment Plan Summary: Daily contact with patient to assess and evaluate symptoms and progress in treatment and Medication management   Mr. Riddles is a 37 year old male with no past psychiatric history admitted for a manic episode.  #Psychosis -start Depakote 500 mg BID -start Risperdal 2 mg BID  #Insomnia -Trazodone 100 mg nightly  #Labs -lipid panel, TSH and A1C -EKG  #Disposition -discharge to home with family -follow up with [facility]   Observation Level/Precautions:  15 minute checks  Laboratory:  CBC Chemistry Profile UDS UA  Psychotherapy:    Medications:    Consultations:    Discharge Concerns:    Estimated LOS:  Other:     Physician Treatment Plan for Primary Diagnosis: Bipolar I disorder, current or most recent episode manic, with psychotic features ([place]) Long Term Goal(s): Improvement in symptoms so as ready for discharge  Short Term Goals: Ability to identify changes in lifestyle to reduce recurrence of condition will improve, Ability to verbalize feelings will improve, Ability to disclose and discuss suicidal ideas, Ability to demonstrate self-control will improve, Ability to identify and develop effective  coping behaviors will improve, Ability to maintain clinical measurements within normal limits will improve, Compliance with prescribed medications will improve and Ability to identify triggers associated with substance abuse/mental health issues will improve  Physician Treatment Plan for Secondary Diagnosis: Principal Problem:   Bipolar I disorder, current or most recent episode manic, with psychotic features ([place])  Long Term Goal(s): NA  Short Term Goals: NA  I certify that inpatient services furnished can reasonably be expected to improve the patient's condition.    Orson Slick, MD 6/11/20195:49 PM

## 2017-07-16 NOTE — ED Notes (Signed)
Spoke to psychiatrist, she received report and is ready to speak to patient. He is sitting in interview room with security outside of room

## 2017-07-16 NOTE — ED Notes (Signed)
[facility]

## 2017-07-16 NOTE — ED Notes (Addendum)
MD Don PerkingVeronese speaking with patients wife at this time.

## 2017-07-16 NOTE — BH Assessment (Signed)
Writer attempted to assess, pt asleep at time

## 2017-07-16 NOTE — BH Assessment (Signed)
Patient [facility] by Dr. Jennet MaduroPucilowska.  Attending Physician will be Dr. Johnella MoloneyMcNew.   Patient has been [facility] to room 315[facility] Nurse Lillette BoxerGwen F.   ER staff is aware of the admission:  Misty StanleyLisa, ER Kathrynn SpeedSectary   Dr. Don PerkingVeronese, ER MD   Ethelene BrownsAnthony, Patient Access.

## 2017-07-16 NOTE — ED Notes (Signed)
Pt came out of room and motioned toward the EMS doors, pt notified to return to room by [employer] officer. Pt states "I just want some fresh air, I always get up around this time." Pt notified he is not able to go outside, pt started to ambulate to door however was redirected back to his room. Pt laid back in his bed with blanket to abd, hands visible.

## 2017-07-16 NOTE — ED Notes (Signed)
Urine specimen sent to lab

## 2017-07-16 NOTE — Tx Team (Signed)
Initial Treatment Plan 07/16/2017 6:50 PM Tyler ConverseAdam D Watts [id number]    PATIENT STRESSORS: Occupational concerns Other: life and sef stress   PATIENT STRENGTHS: Ability for insight Active sense of humor Average or above average intelligence Capable of independent living Communication skills Motivation for treatment/growth Physical Health Supportive family/friends Work skills   PATIENT IDENTIFIED PROBLEMS: Unexplained behavior over the past sevefral days                     DISCHARGE CRITERIA:  Ability to meet basic life and health needs Adequate post-discharge living arrangements Improved stabilization in mood, thinking, and/or behavior  PRELIMINARY DISCHARGE PLAN: Return to previous living arrangement Return to previous work or school arrangements  PATIENT/FAMILY INVOLVEMENT: This treatment plan has been presented to and reviewed with the patient, Tyler Conversedam D Tyler Watts.  The patient has been given the opportunity to ask questions and make suggestions.  Rex KrasJoanne  Travanti Mcmanus, RN 07/16/2017, 6:50 PM

## 2017-07-16 NOTE — ED Notes (Signed)
Pt came out of his room , demanding to go outside.  Getting in security's face and saying hes 'going to get some fresh air'.  Pt staring down officer.  After several minutes, pt was redirected back to his room.

## 2017-07-16 NOTE — ED Notes (Signed)
Pt came out of room, demanding to go outside for fresh air.  Pt continues to walk down hallway pulling away from officers headed toward EMS bay.  After several minutes, pt was redirected back to room.
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
## 2017-07-17 LAB — HEMOGLOBIN A1C
Hgb A1c MFr Bld: 4.9 % (ref 4.8–5.6)
MEAN PLASMA GLUCOSE: 93.93 mg/dL

## 2017-07-17 LAB — LIPID PANEL
CHOLESTEROL: 164 mg/dL (ref 0–200)
HDL: 36 mg/dL — ABNORMAL LOW (ref >40)
LDL Cholesterol: 109 mg/dL — ABNORMAL HIGH (ref 0–99)
Total CHOL/HDL Ratio: 4.6 RATIO
Triglycerides: 94 mg/dL (ref <150)
VLDL: 19 mg/dL (ref 0–40)

## 2017-07-17 MED ORDER — OLANZAPINE 5 MG PO TBDP
10.0000 mg | ORAL_TABLET | Freq: Every day | ORAL | Status: DC
  Administered 2017-07-17 – 2017-07-18 (×2): 10 mg via ORAL
  Filled 2017-07-17 (×2): qty 2

## 2017-07-17 MED ORDER — RISPERIDONE 1 MG PO TABS: 3.0000 mg | ORAL_TABLET | Freq: Every day | ORAL | Status: DC

## 2017-07-17 NOTE — Progress Notes (Addendum)
[facility] MD Progress Note  07/17/2017 11:20 AM Tyler Watts  MRN:  [id number]  Subjective:    Mr. Sliter met with treatment team. He is still hallucinating and presents with odd affect. He responds to question with a question, often quizzical or sarcastic. H e reports feeling "in the fog". Accepts medications. No somatic complaints. Started participating in programming.   Principal Problem: Bipolar I disorder, current or most recent episode manic, with psychotic features ([place]) Diagnosis:   Patient Active Problem List   Diagnosis Date Noted  . Bipolar I disorder, current or most recent episode manic, with psychotic features ([place]) [F31.2] 07/16/2017    Priority: High   Total Time spent with patient: 20 minutes  Past Psychiatric History: none  Past Medical History:  Past Medical History:  Diagnosis Date  . Psoriasis of scalp    History reviewed. No pertinent surgical history. Family History: History reviewed. No pertinent family history. Family Psychiatric  History: none Social History:  Social History   Substance and Sexual Activity  Alcohol Use No   Comment: 2 beers in a year     Social History   Substance and Sexual Activity  Drug Use Not Currently    Social History   Socioeconomic History  . Marital status: Married    Spouse name: Not on file  . Number of children: Not on file  . Years of education: Not on file  . Highest education level: Not on file  Occupational History  . Not on file  Social Needs  . Financial resource strain: Not on file  . Food insecurity:    Worry: Not on file    Inability: Not on file  . Transportation needs:    Medical: Not on file    Non-medical: Not on file  Tobacco Use  . Smoking status: Former Smoker    Last attempt to quit: 2017    Years since quitting: 2.4  . Smokeless tobacco: Never Used  Substance and Sexual Activity  . Alcohol use: No    Comment: 2 beers in a year  . Drug use: Not Currently  . Sexual activity: Yes   Lifestyle  . Physical activity:    Days per week: Not on file    Minutes per session: Not on file  . Stress: Not on file  Relationships  . Social connections:    Talks on phone: Not on file    Gets together: Not on file    Attends religious service: Not on file    Active member of club or organization: Not on file    Attends meetings of clubs or organizations: Not on file    Relationship status: Not on file  Other Topics Concern  . Not on file  Social History Narrative  . Not on file   Additional Social History:                         Sleep: Fair  Appetite:  Fair  Current Medications: Current Facility-Administered Medications  Medication Dose Route Frequency Provider Last Rate Last Dose  . acetaminophen (TYLENOL) tablet 650 mg  650 mg Oral Q6H PRN Arlee Santosuosso B, MD      . alum & mag hydroxide-simeth (MAALOX/MYLANTA) 200-200-20 MG/5ML suspension 30 mL  30 mL Oral Q4H PRN Mieshia Pepitone B, MD      . divalproex (DEPAKOTE) DR tablet 500 mg  500 mg Oral Q8H Carmella Kees B, MD   500 mg at 07/17/17  2353  . magnesium hydroxide (MILK OF MAGNESIA) suspension 30 mL  30 mL Oral Daily PRN Kariann Wecker B, MD      . risperiDONE (RISPERDAL) tablet 3 mg  3 mg Oral QHS Marquitta Persichetti B, MD      . traZODone (DESYREL) tablet 100 mg  100 mg Oral QHS Libby Goehring B, MD   100 mg at 07/16/17 2139    Lab Results:  Results for orders placed or performed during the hospital encounter of 07/16/17 (from the past 48 hour(s))  Hemoglobin A1c     Status: None   Collection Time: 07/17/17  6:58 AM  Result Value Ref Range   Hgb A1c MFr Bld 4.9 4.8 - 5.6 %    Comment: (NOTE) Pre diabetes:          5.7%-6.4% Diabetes:              >6.4% Glycemic control for   <7.0% adults with diabetes    Mean Plasma Glucose 93.93 mg/dL    Comment: Performed at [facility], [place] [address] [place], [place] [address]  Lipid panel     Status: Abnormal    Collection Time: 07/17/17  6:58 AM  Result Value Ref Range   Cholesterol 164 0 - 200 mg/dL   Triglycerides 94 <150 mg/dL   HDL 36 (L) >40 mg/dL   Total CHOL/HDL Ratio 4.6 RATIO   VLDL 19 0 - 40 mg/dL   LDL Cholesterol 109 (H) 0 - 99 mg/dL    Comment:        Total Cholesterol/HDL:CHD Risk Coronary Heart Disease Risk Table                     Men   Women  1/2 Average Risk   3.4   3.3  Average Risk       5.0   4.4  2 X Average Risk   9.6   7.1  3 X Average Risk  23.4   11.0        Use the calculated Patient Ratio above and the CHD Risk Table to determine the patient's CHD Risk.        ATP III CLASSIFICATION (LDL):  <100     mg/dL   Optimal  100-129  mg/dL   Near or Above                    Optimal  130-159  mg/dL   Borderline  160-189  mg/dL   High  >190     mg/dL   Very High Performed at [facility], [address] [place], [place] [address]     Blood Alcohol level:  Lab Results  Component Value Date   [facility] <10 86/76/1951    Metabolic Disorder Labs: Lab Results  Component Value Date   HGBA1C 4.9 07/17/2017   MPG 93.93 07/17/2017   No results found for: PROLACTIN Lab Results  Component Value Date   CHOL 164 07/17/2017   TRIG 94 07/17/2017   HDL 36 (L) 07/17/2017   CHOLHDL 4.6 07/17/2017   VLDL 19 07/17/2017   LDLCALC 109 (H) 07/17/2017    Physical Findings: AIMS:  , ,  ,  ,    CIWA:    COWS:     Musculoskeletal: Strength & Muscle Tone: within normal limits Gait & Station: normal Patient leans: N/A  Psychiatric Specialty Exam: Physical Exam  Nursing note and vitals reviewed. Psychiatric: His affect is inappropriate.  His speech is delayed and tangential. He is slowed and actively hallucinating. Thought content is delusional. Cognition and memory are normal. He expresses impulsivity.    Review of Systems  Neurological: Negative.   Psychiatric/Behavioral: Positive for hallucinations. The patient has insomnia.   All other systems reviewed  and are negative.   Blood pressure 131/76, pulse (!) 142, temperature 98 F (36.7 C), temperature source Oral, resp. rate 18, height 5' 5.75" (1.67 m), weight 93.4 kg (206 lb), SpO2 97 %.Body mass index is 33.5 kg/m.  General Appearance: Casual  Eye Contact:  Good  Speech:  Clear and Coherent  Volume:  Normal  Mood:  Irritable  Affect:  Congruent  Thought Process:  Goal Directed and Descriptions of Associations: Intact  Orientation:  Full (Time, Place, and Person)  Thought Content:  Delusions, Hallucinations: Auditory and Paranoid Ideation  Suicidal Thoughts:  No  Homicidal Thoughts:  No  Memory:  Immediate;   Fair Recent;   Fair Remote;   Fair  Judgement:  Poor  Insight:  Lacking  Psychomotor Activity:  Normal  Concentration:  Concentration: Fair and Attention Span: Fair  Recall:  [employer] of Knowledge:  Fair  Language:  Fair  Akathisia:  No  Handed:  Right  AIMS (if indicated):     Assets:  Communication Skills Desire for Improvement Housing Intimacy Physical Health Resilience Social Support  ADL's:  Intact  Cognition:  WNL  Sleep:  Number of Hours: 7     Treatment Plan Summary: Daily contact with patient to assess and evaluate symptoms and progress in treatment and Medication management   Mr. Mackowski is a 37 year old male with no past psychiatric history admitted for a manic episode.  #Psychosis -continue Depakote 500 mg TID -change Risperdal to 3 mg nightly only   #Insomnia -Trazodone 100 mg nightly  #Tachycardia -likely from Risperdal, his dose was adjusted  #Labs -lipid panel, TSH and A1C are unremarkable -EKG  #Disposition -discharge to home with family -follow up with [facility]    Orson Slick, MD 07/17/2017, 11:20 AM

## 2017-07-17 NOTE — Progress Notes (Signed)
Recreation Therapy Notes  INPATIENT RECREATION THERAPY ASSESSMENT  Patient Details Name: Tyler Watts MRN: [id number] DOB: 09/17/1980 Today's Date: 07/17/2017       Information Obtained From: Patient  Able to Participate in Assessment/Interview: Yes  Patient Presentation: Responsive  Reason for Admission (Per Patient): Other (Comments)(I was acting crazy)  Patient Stressors: Family  Coping Skills:   Other (Comment)(Lay in my Hammock)  Leisure Interests (2+):  Ashby DawesNature - Fishing(Go to the lake)  Frequency of Recreation/Participation: Monthly  Awareness of Community Resources:     WalgreenCommunity Resources:     Current Use:    If no, Barriers?:    Expressed Interest in State Street CorporationCommunity Resource Information:    [place]  Patient Main Form of Transportation: Set designerCar  Patient Strengths:  Child psychotherapistice,Helpf, I try to keep cool  Patient Identified Areas of Improvement:  communication  Patient Goal for Hospitalization:  To get better  Current SI (including self-harm):  No  Current HI:  No  Current AVH: No  Staff Intervention Plan: Group Attendance, Collaborate with Interdisciplinary Treatment Team  Consent to Intern Participation: N/A  Tyler Watts 07/17/2017, 1:21 PM
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
## 2017-07-17 NOTE — Plan of Care (Signed)
Patient verbalizes understanding of the general information that has been provided to him and has not voiced any further questions or concerns at this time. Patient denies SI/HI/AVH as well as any signs/symptoms of depression/anxiety at this time. Patient has the ability to identify the available resources that can help him meet his health care needs. Patient has been in compliance with his prescribed therapeutic regimen and has not voiced any further questions or concerns at this time. Patient remains safe on the unit at this time.  Problem: Education: Goal: Knowledge of [facility] Education information/materials will improve Outcome: Progressing Goal: Emotional status will improve Outcome: Progressing Goal: Mental status will improve Outcome: Progressing Goal: Verbalization of understanding the information provided will improve Outcome: Progressing   Problem: Health Behavior/Discharge Planning: Goal: Identification of resources available to assist in meeting health care needs will improve Outcome: Progressing Goal: Compliance with treatment plan for underlying cause of condition will improve Outcome: Progressing

## 2017-07-17 NOTE — Progress Notes (Signed)
D- Patient alert and oriented. Patient presents in a pleasant mood on assessment stating that he did not sleep too well last night because he tossed and turned throughout the night. Patient denies SI, HI, AVH, and pain at this time. Patient also denies any signs/symptoms of depression/anxiety. Patient didn't have a stated goal for today because "it's not a lot I can do in here".   A- Scheduled medications administered to patient, per MD orders. Support and encouragement provided.  Routine safety checks conducted every 15 minutes.  Patient informed to notify staff with problems or concerns.  R- No adverse drug reactions noted. Patient contracts for safety at this time. Patient compliant with medications and treatment plan. Patient receptive, calm, and cooperative. Patient interacts well with others on the unit.  Patient remains safe at this time.

## 2017-07-17 NOTE — [facility] Group Notes (Signed)
[facility] Group Notes:  (Nursing/MHT/Case Management/Adjunct)  Date:  07/17/2017  Time:  9:39 PM  Type of Therapy:  Group Therapy  Participation Level:  Active  Participation Quality:  Appropriate  Affect:  Appropriate  Cognitive:  Appropriate  Insight:  Appropriate  Engagement in Group:  Engaged  Modes of Intervention:  Discussion  Summary of Progress/Problems:  Tyler EkJanice Marie Kaylene Watts 07/17/2017, 9:39 PM

## 2017-07-17 NOTE — Progress Notes (Signed)
Recreation Therapy Notes  Date: 07/17/2017  Time: 9:30 am  Location: Craft Room  Behavioral response: Appropriate   Intervention Topic: Communication  Discussion/Intervention:  Group content today was focused on communication. The group defined communication and ways to communicate with others. Individuals stated reason why communication is important and some reasons to communicate with others. Patients expressed if they thought they were good at communicating with others and ways they could improve their communication skills. The group identified important parts of communication and some experiences they have had in the past with communication. The group participated in the intervention "What is that?", where they had a chance to test out their communication skills and identify ways to improve their communication techniques.   Clinical Observations/Feedback:  Patient came to group late due to unknown reasons. He participated in the intervention and was social with peers and staff during group.          Tyler Watts 07/17/2017 12:05 PM

## 2017-07-17 NOTE — Progress Notes (Signed)
A&Ox3 this morning, thoughts are logical, reasonable, speech cleared up, affect and mood bright and jovial.

## 2017-07-17 NOTE — Tx Team (Addendum)
Interdisciplinary Treatment and Diagnostic Plan Update  07/17/2017 Time of Session: 10:36am Murrell Conversedam D Sossamon MRN: [id number]  Principal Diagnosis: Bipolar I disorder, current or most recent episode manic, with psychotic features (HCC)  Secondary Diagnoses: Principal Problem:   Bipolar I disorder, current or most recent episode manic, with psychotic features (HCC)   Current Medications:  Current Facility-Administered Medications  Medication Dose Route Frequency Provider Last Rate Last Dose  . acetaminophen (TYLENOL) tablet 650 mg  650 mg Oral Q6H PRN Pucilowska, Jolanta B, MD      . alum & mag hydroxide-simeth (MAALOX/MYLANTA) 200-200-20 MG/5ML suspension 30 mL  30 mL Oral Q4H PRN Pucilowska, Jolanta B, MD      . divalproex (DEPAKOTE) DR tablet 500 mg  500 mg Oral Q8H Pucilowska, Jolanta B, MD   500 mg at 07/17/17 0612  . magnesium hydroxide (MILK OF MAGNESIA) suspension 30 mL  30 mL Oral Daily PRN Pucilowska, Jolanta B, MD      . risperiDONE (RISPERDAL) tablet 3 mg  3 mg Oral QHS Pucilowska, Jolanta B, MD      . traZODone (DESYREL) tablet 100 mg  100 mg Oral QHS Pucilowska, Jolanta B, MD   100 mg at 07/16/17 2139   PTA Medications: Medications Prior to Admission  Medication Sig Dispense Refill Last Dose  . clobetasol cream (TEMOVATE) 0.05 % Apply 1 application topically 2 (two) times daily. (Patient not taking: Reported on 07/16/2017) 30 g 0 Not Taking at Unknown time  . predniSONE (STERAPRED UNI-PAK 21 TAB) 10 MG (21) TBPK tablet Take 1 tablet (10 mg total) by mouth daily. Taper dose for 6 days. (Patient not taking: Reported on 07/16/2017) 21 tablet 0 Completed Course at Unknown time  . sulfamethoxazole-trimethoprim (BACTRIM DS,SEPTRA DS) 800-160 MG per tablet Take 1 tablet by mouth 2 (two) times daily. (Patient not taking: Reported on 07/16/2017) 20 tablet 0 Completed Course at Unknown time    Patient Stressors: Occupational concerns Other: life and sef stress  Patient Strengths: Ability  for insight Active sense of humor Average or above average intelligence Capable of independent living Communication skills Motivation for treatment/growth Physical Health Supportive family/friends Work skills  Treatment Modalities: Medication Management, Group therapy, Case management,  1 to 1 session with clinician, Psychoeducation, Recreational therapy.   Physician Treatment Plan for Primary Diagnosis: Bipolar I disorder, current or most recent episode manic, with psychotic features (HCC) Long Term Goal(s): Improvement in symptoms so as ready for discharge NA   Short Term Goals: Ability to identify changes in lifestyle to reduce recurrence of condition will improve Ability to verbalize feelings will improve Ability to disclose and discuss suicidal ideas Ability to demonstrate self-control will improve Ability to identify and develop effective coping behaviors will improve Ability to maintain clinical measurements within normal limits will improve Compliance with prescribed medications will improve Ability to identify triggers associated with substance abuse/mental health issues will improve NA  Medication Management: Evaluate patient's response, side effects, and tolerance of medication regimen.  Therapeutic Interventions: 1 to 1 sessions, Unit Group sessions and Medication administration.  Evaluation of Outcomes: Progressing  Physician Treatment Plan for Secondary Diagnosis: Principal Problem:   Bipolar I disorder, current or most recent episode manic, with psychotic features (HCC)  Long Term Goal(s): Improvement in symptoms so as ready for discharge NA   Short Term Goals: Ability to identify changes in lifestyle to reduce recurrence of condition will improve Ability to verbalize feelings will improve Ability to disclose and discuss suicidal ideas Ability to demonstrate self-control  will improve Ability to identify and develop effective coping behaviors will  improve Ability to maintain clinical measurements within normal limits will improve Compliance with prescribed medications will improve Ability to identify triggers associated with substance abuse/mental health issues will improve NA     Medication Management: Evaluate patient's response, side effects, and tolerance of medication regimen.  Therapeutic Interventions: 1 to 1 sessions, Unit Group sessions and Medication administration.  Evaluation of Outcomes: Progressing   RN Treatment Plan for Primary Diagnosis: Bipolar I disorder, current or most recent episode manic, with psychotic features (HCC) Long Term Goal(s): Knowledge of disease and therapeutic regimen to maintain health will improve  Short Term Goals: Ability to verbalize feelings will improve, Ability to identify and develop effective coping behaviors will improve and Compliance with prescribed medications will improve  Medication Management: RN will administer medications as ordered by provider, will assess and evaluate patient's response and provide education to patient for prescribed medication. RN will report any adverse and/or side effects to prescribing provider.  Therapeutic Interventions: 1 on 1 counseling sessions, Psychoeducation, Medication administration, Evaluate responses to treatment, Monitor vital signs and CBGs as ordered, Perform/monitor CIWA, COWS, AIMS and Fall Risk screenings as ordered, Perform wound care treatments as ordered.  Evaluation of Outcomes: Progressing   LCSW Treatment Plan for Primary Diagnosis: Bipolar I disorder, current or most recent episode manic, with psychotic features (HCC) Long Term Goal(s): Safe transition to appropriate next level of care at discharge, Engage patient in therapeutic group addressing interpersonal concerns.  Short Term Goals: Engage patient in aftercare planning with referrals and resources, Increase social support and Increase skills for wellness and  recovery  Therapeutic Interventions: Assess for all discharge needs, 1 to 1 time with Social worker, Explore available resources and support systems, Assess for adequacy in community support network, Educate family and significant other(s) on suicide prevention, Complete Psychosocial Assessment, Interpersonal group therapy.  Evaluation of Outcomes: Progressing   Progress in Treatment: Attending groups: No. Participating in groups: No. Taking medication as prescribed: Yes. Toleration medication: Yes. Family/Significant other contact made: No, will contact:  Wife [name] Patient understands diagnosis: Yes. Discussing patient identified problems/goals with staff: Yes. Medical problems stabilized or resolved: Yes. Denies suicidal/homicidal ideation: Yes. Issues/concerns per patient self-inventory: No. Other:  New problem(s) identified: No, Describe:  None  New Short Term/Long Term Goal(s): "To get well."  Patient Goals:  "To get well."  Discharge Plan or Barriers: To return home and follow up with outpatient treatment.  Reason for Continuation of Hospitalization: Medication stabilization  Estimated Length of Stay: 3-5 days  Recreational Therapy: Patient Stressors: Family Patient Goal: Patient will identify 3 positive coping skills strategies to use post d/c within 5 recreation therapy group sessions  Attendees: Patient: Tyler Watts 07/17/2017 11:29 AM  Physician: Dr. Jennet Maduro, MD 07/17/2017 11:29 AM  Nursing: Hulan Amato, RN 07/17/2017 11:29 AM  RN Care Manager: 07/17/2017 11:29 AM  Social Worker: Johny Shears, LCSWA 07/17/2017 11:29 AM  Recreational Therapist: Danella Deis. Dreama Saa, LRT 07/17/2017 11:29 AM  Other: Heidi Dach, LCSW 07/17/2017 11:29 AM  Other: Damian Leavell, Chaplin 07/17/2017 11:29 AM  Other: Huey Romans, LCSW 07/17/2017 11:29 AM    Scribe for Treatment Team: Johny Shears, LCSW 07/17/2017 11:29 AM

## 2017-07-17 NOTE — Progress Notes (Signed)
Patient ID: Tyler Watts, male   DOB: 09/08/1980, 37 y.o.   MRN: [id number] Disorganized, crawled up in bed in consolation, confused, disoriented. "I feel like Sh-t.." unable to clarify what he means, speech clear but illogical with poor contents; patient unable to participate in a constructive conversation at this time, medications given at the bedside.

## 2017-07-17 NOTE — [facility] Suicide Risk Assessment (Signed)
[facility] INPATIENT:  Family/Significant Other Suicide Prevention Education  Suicide Prevention Education:  Education Completed; [name], Wife, [phone] has been identified by the patient as the family member/significant other with whom the patient will be residing, and identified as the person(s) who will aid the patient in the event of a mental health crisis (suicidal ideations/suicide attempt).  With written consent from the patient, the family member/significant other has been provided the following suicide prevention education, prior to the and/or following the discharge of the patient.  The suicide prevention education provided includes the following:  Suicide risk factors  Suicide prevention and interventions  National Suicide Hotline telephone number  [facility] assessment telephone number  [facility] Assistance 911  [facility] and/or [facility] telephone number  Request made of family/significant other to:  Remove weapons (e.g., guns, rifles, knives), all items previously/currently identified as safety concern.    Remove drugs/medications (over-the-counter, prescriptions, illicit drugs), all items previously/currently identified as a safety concern.  The family member/significant other verbalizes understanding of the suicide prevention education information provided.  The family member/significant other agrees to remove the items of safety concern listed above.  CSW spoke with the patients regarding the precipitating factors that led to his admission. She described behaviors that he was happening such as him being hyper-religious. She state that his is the rock of the family and that he has never had any of these behaviors before. The wife reports that he has been putting a lot of stress on himself lately. She says that before he came in he wasn't eating or sleeping. Wife reports that the patients mother is wanting to come  see him and she says that the patient told her that he doesn't want to see her until he is back home but will not tell her that. The wife has concerns with the mother coming to visit because she she believes that nothing is wrong with him and adds stress on to him which may cause him decline. Wife also reports that all guns/weapons will be removed from the home before the patient is discharged home. She reports that the patient can return home once he is stabilized.  Tyler ShearsCassandra  Tyler Watts 07/17/2017, 1:30 PM

## 2017-07-17 NOTE — [facility] Group Notes (Signed)
LCSW Group Therapy Note  07/17/2017 1:00 pm  Type of Therapy/Topic:  Group Therapy:  Emotion Regulation  Participation Level:  Did Not Attend   Description of Group:    The purpose of this group is to assist patients in learning to regulate negative emotions and experience positive emotions. Patients will be guided to discuss ways in which they have been vulnerable to their negative emotions. These vulnerabilities will be juxtaposed with experiences of positive emotions or situations, and patients will be challenged to use positive emotions to combat negative ones. Special emphasis will be placed on coping with negative emotions in conflict situations, and patients will process healthy conflict resolution skills.  Therapeutic Goals: 1. Patient will identify two positive emotions or experiences to reflect on in order to balance out negative emotions 2. Patient will label two or more emotions that they find the most difficult to experience 3. Patient will demonstrate positive conflict resolution skills through discussion and/or role plays  Summary of Patient Progress:   Tyler Watts was invited to today's group, but chose not to attend.    Therapeutic Modalities:   Cognitive Behavioral Therapy Feelings Identification Dialectical Behavioral Therapy

## 2017-07-17 NOTE — Plan of Care (Signed)
Patient slept for Estimated Hours of 7; Precautionary checks every 15 minutes for safety maintained, room free of safety hazards, patient sustains no injury or falls during this shift.  Problem: Education: Goal: Emotional status will improve Outcome: Not Progressing Goal: Mental status will improve Outcome: Not Progressing Goal: Verbalization of understanding the information provided will improve Outcome: Not Progressing

## 2017-07-18 MED ORDER — TRAZODONE HCL 100 MG PO TABS: 100.0000 mg | ORAL_TABLET | Freq: Every evening | ORAL | Status: DC | PRN

## 2017-07-18 NOTE — Plan of Care (Signed)
Improving and doing much better in affect and mood, thoughts are logical, articulate, listens actively but still have poor insight to his disease process. Per his wife's request to see me, I had a one-to-one of my presence with Tyler Watts, patient's wife; worried, "I have never seeing him like this prior to his admission, I want him to get well, please don't just send him home to us until I can be assured by myself that he is ready..." Clinical and moral support provided to patient and family.  Patient slept for Estimated Hours of 7.30; Precautionary checks every 15 minutes for safety maintained, room free of safety hazards, patient sustains no injury or falls during this shift.  Problem: Education: Goal: Emotional status will improve Outcome: Progressing Goal: Mental status will improve Outcome: Progressing Goal: Verbalization of understanding the information provided will improve Outcome: Progressing   Problem: Health Behavior/Discharge Planning: Goal: Compliance with treatment plan for underlying cause of condition will improve Outcome: Progressing

## 2017-07-18 NOTE — Plan of Care (Signed)
Patient verbalizes understanding of the general information that has been provided to him and has not voiced any further questions or concerns at this time. Patient denies SI/HI/AVH as well as any signs/symptoms of anxiety at this time. Patient has the ability to identify the available resources that can help him meet his health care needs. Patient has been in compliance with his prescribed therapeutic regimen and all questions/concerns have been addressed and answered at this time. Patient remains safe on the unit at this time.  Problem: Education: Goal: Knowledge of [facility] Education information/materials will improve Outcome: Progressing Goal: Emotional status will improve Outcome: Progressing Goal: Mental status will improve Outcome: Progressing Goal: Verbalization of understanding the information provided will improve Outcome: Progressing   Problem: Health Behavior/Discharge Planning: Goal: Identification of resources available to assist in meeting health care needs will improve Outcome: Progressing Goal: Compliance with treatment plan for underlying cause of condition will improve Outcome: Progressing

## 2017-07-18 NOTE — [facility] Group Notes (Signed)
  07/18/2017  Time: 1PM  Type of Therapy/Topic:  Group Therapy:  Balance in Life  Participation Level:  Minimal  Description of Group:   This group will address the concept of balance and how it feels and looks when one is unbalanced. Patients will be encouraged to process areas in their lives that are out of balance and identify reasons for remaining unbalanced. Facilitators will guide patients in utilizing problem-solving interventions to address and correct the stressor making their life unbalanced. Understanding and applying boundaries will be explored and addressed for obtaining and maintaining a balanced life. Patients will be encouraged to explore ways to assertively make their unbalanced needs known to significant others in their lives, using other group members and facilitator for support and feedback.  Therapeutic Goals: 1. Patient will identify two or more emotions or situations they have that consume much of in their lives. 2. Patient will identify signs/triggers that life has become out of balance:  3. Patient will identify two ways to set boundaries in order to achieve balance in their lives:  4. Patient will demonstrate ability to communicate their needs through discussion and/or role plays  Summary of Patient Progress: Pt continues to work towards their tx goals but has not yet reached them. Pt was able to appropriately participate in group discussion, and was able to offer support/validation to other group members. Pt reported he would like to spend more time on family and less time on work.    Therapeutic Modalities:   Cognitive Behavioral Therapy Solution-Focused Therapy Assertiveness Training  Heidi DachKelsey Raywood Wailes, MSW, LCSW Clinical Social Worker 07/18/2017 2:04 PM

## 2017-07-18 NOTE — Progress Notes (Signed)
D- Patient alert and oriented. Patient presents in a pleasant mood on assessment stating that he slept "like a rock" last night and did not voice any major complaints to this Clinical research associatewriter. Patient denies SI, HI, AVH, and pain at this time. Patient states to this writer that he is depressed because "I don't like being inside and I'm tired". Patient also denies any signs/symptoms of anxiety at this time. Patient's goal for today is to "stay positive".  A- Scheduled medications administered to patient, per MD orders. Support and encouragement provided.  Routine safety checks conducted every 15 minutes.  Patient informed to notify staff with problems or concerns.  R- No adverse drug reactions noted. Patient contracts for safety at this time. Patient compliant with medications and treatment plan. Patient receptive, calm, and cooperative. Patient interacts well with others on the unit.  Patient remains safe at this time.

## 2017-07-18 NOTE — Progress Notes (Signed)
[facility] MD Progress Note  07/18/2017 6:03 PM Tyler Watts  MRN:  [id number]  Subjective:    Tyler Watts reports feeling better and ready for discharge but he is still delusional and religiously preoccupied. He is still unreasonable and does not want to return to his job. He denies any symptoms and feels ready for discharge. He has no insight   Spoke with the wife who confirms that this is absolutely out of character behavior. She has known him for twenty years as a responsible, sensitive and levelheaded person.  Principal Problem: Bipolar I disorder, current or most recent episode manic, with psychotic features (HCC) Diagnosis:   Patient Active Problem List   Diagnosis Date Noted  . Bipolar I disorder, current or most recent episode manic, with psychotic features (HCC) [F31.2] 07/16/2017    Priority: High   Total Time spent with patient: 20 minutes  Past Psychiatric History: none  Past Medical History:  Past Medical History:  Diagnosis Date  . Psoriasis of scalp    History reviewed. No pertinent surgical history. Family History: History reviewed. No pertinent family history. Family Psychiatric  History: none Social History:  Social History   Substance and Sexual Activity  Alcohol Use No   Comment: 2 beers in a year     Social History   Substance and Sexual Activity  Drug Use Not Currently    Social History   Socioeconomic History  . Marital status: Married    Spouse name: Not on file  . Number of children: Not on file  . Years of education: Not on file  . Highest education level: Not on file  Occupational History  . Not on file  Social Needs  . Financial resource strain: Not on file  . Food insecurity:    Worry: Not on file    Inability: Not on file  . Transportation needs:    Medical: Not on file    Non-medical: Not on file  Tobacco Use  . Smoking status: Former Smoker    Last attempt to quit: 2017    Years since quitting: 2.4  . Smokeless tobacco: Never  Used  Substance and Sexual Activity  . Alcohol use: No    Comment: 2 beers in a year  . Drug use: Not Currently  . Sexual activity: Yes  Lifestyle  . Physical activity:    Days per week: Not on file    Minutes per session: Not on file  . Stress: Not on file  Relationships  . Social connections:    Talks on phone: Not on file    Gets together: Not on file    Attends religious service: Not on file    Active member of club or organization: Not on file    Attends meetings of clubs or organizations: Not on file    Relationship status: Not on file  Other Topics Concern  . Not on file  Social History Narrative  . Not on file   Additional Social History:                         Sleep: Fair  Appetite:  Fair  Current Medications: Current Facility-Administered Medications  Medication Dose Route Frequency Provider Last Rate Last Dose  . acetaminophen (TYLENOL) tablet 650 mg  650 mg Oral Q6H PRN Dietrich Ke B, MD   650 mg at 07/18/17 0810  . alum & mag hydroxide-simeth (MAALOX/MYLANTA) 200-200-20 MG/5ML suspension 30 mL  30 mL Oral  Q4H PRN Candy Leverett B, MD      . divalproex (DEPAKOTE) DR tablet 500 mg  500 mg Oral Q8H Courtez Twaddle B, MD   500 mg at 07/18/17 1500  . magnesium hydroxide (MILK OF MAGNESIA) suspension 30 mL  30 mL Oral Daily PRN Ilze Roselli B, MD      . OLANZapine zydis (ZYPREXA) disintegrating tablet 10 mg  10 mg Oral QHS Icyss Skog B, MD   10 mg at 07/17/17 2137  . traZODone (DESYREL) tablet 100 mg  100 mg Oral QHS PRN Devora Tortorella B, MD        Lab Results:  Results for orders placed or performed during the hospital encounter of 07/16/17 (from the past 48 hour(s))  Hemoglobin A1c     Status: None   Collection Time: 07/17/17  6:58 AM  Result Value Ref Range   Hgb A1c MFr Bld 4.9 4.8 - 5.6 %    Comment: (NOTE) Pre diabetes:          5.7%-6.4% Diabetes:              >6.4% Glycemic control for   <7.0% adults  with diabetes    Mean Plasma Glucose 93.93 mg/dL    Comment: Performed at [facility], [address] [place], [place] [address]  Lipid panel     Status: Abnormal   Collection Time: 07/17/17  6:58 AM  Result Value Ref Range   Cholesterol 164 0 - 200 mg/dL   Triglycerides 94 <409<150 mg/dL   HDL 36 (L) >81>40 mg/dL   Total CHOL/HDL Ratio 4.6 RATIO   VLDL 19 0 - 40 mg/dL   LDL Cholesterol 191109 (H) 0 - 99 mg/dL    Comment:        Total Cholesterol/HDL:CHD Risk Coronary Heart Disease Risk Table                     Men   Women  1/2 Average Risk   3.4   3.3  Average Risk       5.0   4.4  2 X Average Risk   9.6   7.1  3 X Average Risk  23.4   11.0        Use the calculated Patient Ratio above and the CHD Risk Table to determine the patient's CHD Risk.        ATP III CLASSIFICATION (LDL):  <100     mg/dL   Optimal  [id number]  mg/dL   Near or Above                    Optimal  130-159  mg/dL   Borderline  [id number]  mg/dL   High  >657>190     mg/dL   Very High Performed at [facility], [address] [place], [place] [address]     Blood Alcohol level:  Lab Results  Component Value Date   [facility] 07/15/2017    Metabolic Disorder Labs: Lab Results  Component Value Date   HGBA1C 4.9 07/17/2017   MPG 93.93 07/17/2017   No results found for: PROLACTIN Lab Results  Component Value Date   CHOL 164 07/17/2017   TRIG 94 07/17/2017   HDL 36 (L) 07/17/2017   CHOLHDL 4.6 07/17/2017   VLDL 19 07/17/2017   LDLCALC 109 (H) 07/17/2017    Physical Findings: AIMS:  , ,  ,  ,    CIWA:    COWS:  Musculoskeletal: Strength & Muscle Tone: within normal limits Gait & Station: normal Patient leans: N/A  Psychiatric Specialty Exam: Physical Exam  Nursing note and vitals reviewed. Psychiatric: His speech is normal. His mood appears anxious. His affect is inappropriate. He is withdrawn. Thought content is paranoid and delusional. Cognition and memory are impaired. He  expresses impulsivity.    Review of Systems  Neurological: Negative.   Psychiatric/Behavioral: The patient is nervous/anxious and has insomnia.   All other systems reviewed and are negative.   Blood pressure (!) 140/91, pulse (!) 129, temperature 98.4 F (36.9 C), temperature source Oral, resp. rate 18, height 5' 5.75" (1.67 m), weight 93.4 kg (206 lb), SpO2 98 %.Body mass index is 33.5 kg/m.  General Appearance: Casual  Eye Contact:  Good  Speech:  Clear and Coherent  Volume:  Normal  Mood:  Anxious  Affect:  Flat  Thought Process:  Goal Directed and Descriptions of Associations: Intact  Orientation:  Full (Time, Place, and Person)  Thought Content:  Illogical, Delusions, Hallucinations: Auditory, Obsessions and Paranoid Ideation  Suicidal Thoughts:  No  Homicidal Thoughts:  No  Memory:  Immediate;   Fair Recent;   Fair Remote;   Fair  Judgement:  Impaired  Insight:  Lacking  Psychomotor Activity:  Normal  Concentration:  Concentration: Fair and Attention Span: Fair  Recall:  [employer] of Knowledge:  Fair  Language:  Fair  Akathisia:  No  Handed:  Right  AIMS (if indicated):     Assets:  Communication Skills Desire for Improvement Housing Physical Health Resilience Social Support  ADL's:  Intact  Cognition:  WNL  Sleep:  Number of Hours: 7.3     Treatment Plan Summary: Daily contact with patient to assess and evaluate symptoms and progress in treatment and Medication management   Tyler Watts is a 37 year old male with no past psychiatric history admitted for a manic episode.  #Psychosis, still psychotic with no insight -continue Depakote 500 mg TID, VPA level and ammonia in am -discontinue Risperdal, start Zyprexa 10 mg nightly  #Insomnia, resolved -Trazodone 100 mg nightly, change to PRN  #Tachycardia -likely from antipsychotics consider metoprolol  #Labs -lipid panel, TSH and A1C are unremarkable -EKG, reviewed, tachycardia 112, QTc  417  #Disposition -discharge to home with family -follow up with [facility]     Kristine Linea, MD 07/18/2017, 6:03 PM

## 2017-07-18 NOTE — BHH Group Notes (Signed)
LCSW Group Therapy Note 07/18/2017 9:00AM  Type of Therapy and Topic:  Group Therapy:  Setting Goals  Participation Level:  Active  Description of Group: In this process group, patients discussed using strengths to work toward goals and address challenges.  Patients identified two positive things about themselves and one goal they were working on.  Patients were given the opportunity to share openly and support each other's plan for self-empowerment.  The group discussed the value of gratitude and were encouraged to have a daily reflection of positive characteristics or circumstances.  Patients were encouraged to identify a plan to utilize their strengths to work on current challenges and goals.  Therapeutic Goals 1. Patient will verbalize personal strengths/positive qualities and relate how these can assist with achieving desired personal goals 2. Patients will verbalize affirmation of peers plans for personal change and goal setting 3. Patients will explore the value of gratitude and positive focus as related to successful achievement of goals 4. Patients will verbalize a plan for regular reinforcement of personal positive qualities and circumstances.  Summary of Patient Progress:  Tyler Watts actively participated in today's group discussion on setting goals using the SMART Model.  Tyler Watts appears to have a good understanding of how he can using the SMART Model to establish his own life goals.  Tyler Watts shared verbalized affirmation of another group member's goal to "stay positive".  Tyler Watts also shared that he would like to also work on being a better listener and help what others have to say or be more open minded.     Therapeutic Modalities Cognitive Behavioral Therapy Motivational Interviewing    Alease FrameSonya S [place], [place] 07/18/2017 11:38 AM

## 2017-07-18 NOTE — Progress Notes (Signed)
Recreation Therapy Notes  Date: 07/18/2017  Time: 9:30 am  Location: Room 21  Behavioral response: Appropriate   Intervention Topic: Animal Assisted Therapy  Discussion/Intervention:  Patient participated in Animal Assisted Therapy during group today. Group facilitator defined Animal Assisted Therapy as the use of animals as a therapeutic tool to assist a person in restoring balance to their life.  The group facilitator also described the benefits of Animal Assisted Therapy as improving patients' mental, physical, social and emotional functioning with the aid of animals; depending on the needs of the patient. Individuals in the group were able to pet the dogs as well as ask questions.  Clinical Observations/Feedback:  Patient came to group and was focused on what peers and staff had to say about the topic at hand. Individual was social with peers and staff while stay on topic during group. Participant was engaged with the dogs during group. Mervyn Pflaum LRT/CTRS         Kathya Wilz 07/18/2017 10:53 AM

## 2017-07-19 LAB — AMMONIA: Ammonia: 45 umol/L — ABNORMAL HIGH (ref 9–35)

## 2017-07-19 LAB — VALPROIC ACID LEVEL: VALPROIC ACID LVL: 120 ug/mL — AB (ref 50.0–100.0)

## 2017-07-19 MED ORDER — METOPROLOL TARTRATE 25 MG PO TABS
12.5000 mg | ORAL_TABLET | Freq: Two times a day (BID) | ORAL | Status: DC
  Administered 2017-07-20 – 2017-07-22 (×5): 12.5 mg via ORAL
  Filled 2017-07-19 (×5): qty 1

## 2017-07-19 MED ORDER — OLANZAPINE 5 MG PO TBDP
15.0000 mg | ORAL_TABLET | Freq: Every day | ORAL | Status: DC
  Administered 2017-07-19 – 2017-07-21 (×3): 15 mg via ORAL
  Filled 2017-07-19 (×3): qty 3

## 2017-07-19 MED ORDER — DIVALPROEX SODIUM 500 MG PO DR TAB
500.0000 mg | DELAYED_RELEASE_TABLET | Freq: Two times a day (BID) | ORAL | Status: DC
  Administered 2017-07-20 – 2017-07-22 (×5): 500 mg via ORAL
  Filled 2017-07-19 (×5): qty 1

## 2017-07-19 NOTE — Plan of Care (Signed)
Patient is safe and secure in the unit, contract for safety, and denies suicide ideation, participate in groups and assertive with peers, takes his medicines, patient has good appetite and well hydrated with fluids and juices, no distress 15 minute rounding in progress. Problem: Education: Goal: Knowledge of [facility] Education information/materials will improve Outcome: Progressing Goal: Emotional status will improve Outcome: Progressing Goal: Mental status will improve Outcome: Progressing Goal: Verbalization of understanding the information provided will improve Outcome: Progressing   Problem: Health Behavior/Discharge Planning: Goal: Identification of resources available to assist in meeting health care needs will improve Outcome: Progressing Goal: Compliance with treatment plan for underlying cause of condition will improve Outcome: Progressing

## 2017-07-19 NOTE — Progress Notes (Signed)
[facility]  07/19/2017 1:55 PM Tyler Watts  MRN:  [id number]  Subjective:    Tyler Watts feels better today. He did not take Trazodone last night and feels more awake. He however has elevated Depakote and ammonia level that could have contributed to this feeling of "fog". Takes medications, no somatic complains, marginal group participation. He again has been very vague on interview. He does not engage with peers or staff.  Spoke with his uncle who is supportive and worried.   Principal Problem: Bipolar I disorder, current or most recent episode manic, with psychotic features (HCC) Diagnosis:   Patient Active Problem List   Diagnosis Date Noted  . Bipolar I disorder, current or most recent episode manic, with psychotic features (HCC) [F31.2] 07/16/2017    Priority: High   Total Time spent with patient: 20 minutes  Past Psychiatric History: none  Past Medical History:  Past Medical History:  Diagnosis Date  . Psoriasis of scalp    History reviewed. No pertinent surgical history. Family History: History reviewed. No pertinent family history. Family Psychiatric  History: none Social History:  Social History   Substance and Sexual Activity  Alcohol Use No   Comment: 2 beers in a year     Social History   Substance and Sexual Activity  Drug Use Not Currently    Social History   Socioeconomic History  . Marital status: Married    Spouse name: Not on file  . Number of children: Not on file  . Years of education: Not on file  . Highest education level: Not on file  Occupational History  . Not on file  Social Needs  . Financial resource strain: Not on file  . Food insecurity:    Worry: Not on file    Inability: Not on file  . Transportation needs:    Medical: Not on file    Non-medical: Not on file  Tobacco Use  . Smoking status: Former Smoker    Last attempt to quit: 2017    Years since quitting: 2.4  . Smokeless tobacco: Never Used  Substance and  Sexual Activity  . Alcohol use: No    Comment: 2 beers in a year  . Drug use: Not Currently  . Sexual activity: Yes  Lifestyle  . Physical activity:    Days per week: Not on file    Minutes per session: Not on file  . Stress: Not on file  Relationships  . Social connections:    Talks on phone: Not on file    Gets together: Not on file    Attends religious service: Not on file    Active member of club or organization: Not on file    Attends meetings of clubs or organizations: Not on file    Relationship status: Not on file  Other Topics Concern  . Not on file  Social History Narrative  . Not on file   Additional Social History:                         Sleep: Fair  Appetite:  Fair  Current Medications: Current Facility-Administered Medications  Medication Dose Route Frequency Provider Last Rate Last Dose  . acetaminophen (TYLENOL) tablet 650 mg  650 mg Oral Q6H PRN Matasha Smigelski B, MD   650 mg at 07/18/17 0810  . alum & mag hydroxide-simeth (MAALOX/MYLANTA) 200-200-20 MG/5ML suspension 30 mL  30 mL Oral Q4H PRN Jamareon Shimel B,  MD      . Melene Muller ON 07/20/2017] divalproex (DEPAKOTE) DR tablet 500 mg  500 mg Oral Q12H Camera Krienke B, MD      . magnesium hydroxide (MILK OF MAGNESIA) suspension 30 mL  30 mL Oral Daily PRN Jadamarie Butson B, MD      . OLANZapine zydis (ZYPREXA) disintegrating tablet 15 mg  15 mg Oral QHS Aaleeyah Bias B, MD      . traZODone (DESYREL) tablet 100 mg  100 mg Oral QHS PRN Bhavya Eschete B, MD        Lab Results:  Results for orders placed or performed during the hospital encounter of 07/16/17 (from the past 48 hour(s))  Valproic acid level     Status: Abnormal   Collection Time: 07/19/17  6:59 AM  Result Value Ref Range   Valproic Acid Lvl 120 (H) 50.0 - 100.0 ug/mL    Comment: Performed at [facility], [address] [place], [place] [address]  Ammonia     Status: Abnormal   Collection Time:  07/19/17  6:59 AM  Result Value Ref Range   Ammonia 45 (H) 9 - 35 umol/L    Comment: Performed at [facility], [address] [place], [place] [address]    Blood Alcohol level:  Lab Results  Component Value Date   [facility] <10 07/15/2017    Metabolic Disorder Labs: Lab Results  Component Value Date   HGBA1C 4.9 07/17/2017   MPG 93.93 07/17/2017   No results found for: PROLACTIN Lab Results  Component Value Date   CHOL 164 07/17/2017   TRIG 94 07/17/2017   HDL 36 (L) 07/17/2017   CHOLHDL 4.6 07/17/2017   VLDL 19 07/17/2017   LDLCALC 109 (H) 07/17/2017    Physical Findings: AIMS:  , ,  ,  ,    CIWA:    COWS:     Musculoskeletal: Strength & Muscle Tone: within normal limits Gait & Station: normal Patient leans: N/A  Psychiatric Specialty Exam: Physical Exam  Nursing note and vitals reviewed. Psychiatric: His affect is blunt. His speech is delayed. He is slowed and withdrawn. Thought content is delusional. Cognition and memory are normal. He expresses impulsivity.    Review of Systems  Neurological: Negative.   Psychiatric/Behavioral: Positive for hallucinations.  All other systems reviewed and are negative.   Blood pressure 135/76, pulse (!) 115, temperature 98.5 F (36.9 C), temperature source Oral, resp. rate 16, height 5' 5.75" (1.67 m), weight 93.4 kg (206 lb), SpO2 99 %.Body mass index is 33.5 kg/m.  General Appearance: Casual  Eye Contact:  Good  Speech:  Clear and Coherent  Volume:  Normal  Mood:  Euthymic  Affect:  Blunt  Thought Process:  Goal Directed and Descriptions of Associations: Intact  Orientation:  Full (Time, Place, and Person)  Thought Content:  Illogical, Delusions and Paranoid Ideation  Suicidal Thoughts:  No  Homicidal Thoughts:  No  Memory:  Immediate;   Fair Recent;   Fair Remote;   Fair  Judgement:  Impaired  Insight:  Shallow  Psychomotor Activity:  Normal  Concentration:  Concentration: Fair and Attention Span: Fair   Recall:  [employer] of Knowledge:  Fair  Language:  Fair  Akathisia:  No  Handed:  Right  AIMS (if indicated):     Assets:  Communication Skills Desire for Improvement Housing Physical Health Resilience Social Support Transportation Vocational/Educational  ADL's:  Intact  Cognition:  WNL  Sleep:  Number of  Hours: 8.3     Treatment Plan Summary: Daily contact with patient to assess and evaluate symptoms and progress in treatment and Medication management   Tyler Watts is a 37 year old male with no past psychiatric history admitted for a manic episode or mixed episode. VPA and ammonia elevated, will hold today.   #Psychosis, still psychotic with no insight -hold Depakote today, restart tomorrow at a lower dose 500 mg BID, VPA level and ammonia in am -increase Zyprexa zydis to 15 mg nightly   #Insomnia, resolved -discontinue Trazodone   #Tachycardia -likely from antipsychotics  -start Metoprolol 12.5 mg BID  #Labs -lipid panel, TSH and A1Care unremarkable -EKG, reviewed, tachycardia 112, QTc 417  #Disposition -discharge to home with family -follow up with [facility]    Kristine Linea, MD 07/19/2017, 1:55 PM

## 2017-07-19 NOTE — BHH Group Notes (Signed)
B[facility] Group Notes:  (Nursing/MHT/Case Management/Adjunct)  Date:  07/19/2017  Time:  9:27 PM  Type of Therapy:  Group Therapy  Participation Level:  Active  Participation Quality:  Appropriate and Sharing  Affect:  Appropriate  Cognitive:  Appropriate and Oriented  Insight:  Appropriate and Good  Engagement in Group:  Engaged and Supportive  Modes of Intervention:  Discussion and Support  Summary of Progress/Problems:  Tyler NeighborsKeith D Gunnard Watts 07/19/2017, 9:27 PM

## 2017-07-19 NOTE — BHH Group Notes (Signed)
LCSW Group Therapy Note  07/19/2017 1:00pm  Type of Therapy and Topic:  Group Therapy:  Feelings around Relapse and Recovery  Participation Level:  Active   Description of Group:    Patients in this group will discuss emotions they experience before and after a relapse. They will process how experiencing these feelings, or avoidance of experiencing them, relates to having a relapse. Facilitator will guide patients to explore emotions they have related to recovery. Patients will be encouraged to process which emotions are more powerful. They will be guided to discuss the emotional reaction significant others in their lives may have to their relapse or recovery. Patients will be assisted in exploring ways to respond to the emotions of others without this contributing to a relapse.  Therapeutic Goals: 1. Patient will identify two or more emotions that lead to a relapse for them 2. Patient will identify two emotions that result when they relapse 3. Patient will identify two emotions related to recovery 4. Patient will demonstrate ability to communicate their needs through discussion and/or role plays   Summary of Patient Progress: Madelaine Bhatdam actively participated in today's group discussion on feelings around relapse and recovery. Teondre was able to identify emotions that he experiences that lead to a relapse (stressed, guilt) and emotions that he experiences when he is relapsing (shameful, guilt);  Gerron shared that an emotion that he often experiences when he is in recovery is hopeful.    Therapeutic Modalities:   Cognitive Behavioral Therapy Solution-Focused Therapy Assertiveness Training Relapse Prevention Therapy   Alease FrameSonya S Aggie Douse, LCSW 07/19/2017 2:37 PM

## 2017-07-19 NOTE — Plan of Care (Signed)
Data: Patient is appropriate and cooperative to assessment. Patient denies SI/HI and denies AVH. Patient has completed daily self inventory worksheet. Patient reports fair mood and comments on no longer feeling "in a haze." Patient has COMPLAINTS and a pain rating of 0/10. Patient reports good sleep quality, appetite is good. Patient rates depression "0/10" , feelings of hopelessness "0/10" and anxiety "0/10" Patients goal for today is "stay positive." Patient is present in milieu. Patient attends groups.  Action:  Q x 15 minute observation checks were completed for safety. Patient was provided with education on medications. Patient was offered support and encouragement. Patient was given scheduled medications. Patient  was encourage to attend groups, participate in unit activities and continue with plan of care.    Response: Patient has no complaints at this time. Patient is receptive to treatment and safety maintained on unit.    Problem: Education: Goal: Knowledge of  General Education information/materials will improve Outcome: Progressing Goal: Emotional status will improve Outcome: Progressing   Problem: Safety: Goal: Ability to remain free from injury will improve Outcome: Progressing

## 2017-07-20 LAB — VALPROIC ACID LEVEL: Valproic Acid Lvl: 40 ug/mL — ABNORMAL LOW (ref 50.0–100.0)

## 2017-07-20 LAB — AMMONIA: Ammonia: 34 umol/L (ref 9–35)

## 2017-07-20 MED ORDER — OLANZAPINE 5 MG PO TABS
ORAL_TABLET | ORAL | Status: AC
  Filled 2017-07-20: qty 2

## 2017-07-20 NOTE — BHH Group Notes (Signed)
LCSW Group Therapy Note  07/20/2017 1:15pm  Type of Therapy and Topic:  Group Therapy:  Fears and Unhealthy Coping Skills  Participation Level:  None   Description of Group:  The focus of this group was to discuss some of the prevalent fears that patients experience, and to identify the commonalities among group members.  An exercise was used to initiate the discussion, followed by writing on the white board a group-generated list of unhealthy coping and healthy coping techniques to deal with each fear.    Therapeutic Goals: 1. Patient will identify and describe 3 fears they experience 2. Patient will identify one positive coping strategy for each fear they experience 3. Patient will respond empathically to peers statements regarding fears they experience  Summary of Patient Progress:  Pt attended group but did not participate. When asked about the topic he said "I pass."     Therapeutic Modalities Cognitive Behavioral Therapy Motivational Interviewing  Phelicia Dantes  CUEBAS-COLON, LCSW 07/20/2017 4:40 PM

## 2017-07-20 NOTE — Plan of Care (Signed)
Patient is responding well to treatment  modalities, attends groups with peers, voice no concerns, patient is able to voice positive attributes about, contract for safety of self  and others, aware of his coping skills, denies SI/HI, no signs of AVH, 15 minute rounding in progress. Problem: Education: Goal: Knowledge of [facility] Education information/materials will improve Outcome: Progressing Goal: Emotional status will improve Outcome: Progressing Goal: Mental status will improve Outcome: Progressing Goal: Verbalization of understanding the information provided will improve Outcome: Progressing   Problem: Health Behavior/Discharge Planning: Goal: Identification of resources available to assist in meeting health care needs will improve Outcome: Progressing Goal: Compliance with treatment plan for underlying cause of condition will improve Outcome: Progressing   Problem: Safety: Goal: Ability to remain free from injury will improve Outcome: Progressing

## 2017-07-20 NOTE — Progress Notes (Signed)
[facility]  07/20/2017 4:27 PM Tyler Conversedam D Munshi  MRN:  [id number]  Subjective:    Pt calmer, cooperative, taking medicine, was taking lunch , reports feeling better.  Principal Problem: Bipolar I disorder, current or most recent episode manic, with psychotic features (HCC) Diagnosis:   Patient Active Problem List   Diagnosis Date Noted  . Bipolar I disorder, current or most recent episode manic, with psychotic features (HCC) [F31.2] 07/16/2017   Total Time spent with patient:25 min  Past Psychiatric History: none  Past Medical History:  Past Medical History:  Diagnosis Date  . Psoriasis of scalp    History reviewed. No pertinent surgical history. Family History: History reviewed. No pertinent family history. Family Psychiatric  History: none Social History:  Social History   Substance and Sexual Activity  Alcohol Use No   Comment: 2 beers in a year     Social History   Substance and Sexual Activity  Drug Use Not Currently    Social History   Socioeconomic History  . Marital status: Married    Spouse name: Not on file  . Number of children: Not on file  . Years of education: Not on file  . Highest education level: Not on file  Occupational History  . Not on file  Social Needs  . Financial resource strain: Not on file  . Food insecurity:    Worry: Not on file    Inability: Not on file  . Transportation needs:    Medical: Not on file    Non-medical: Not on file  Tobacco Use  . Smoking status: Former Smoker    Last attempt to quit: 2017    Years since quitting: 2.4  . Smokeless tobacco: Never Used  Substance and Sexual Activity  . Alcohol use: No    Comment: 2 beers in a year  . Drug use: Not Currently  . Sexual activity: Yes  Lifestyle  . Physical activity:    Days per week: Not on file    Minutes per session: Not on file  . Stress: Not on file  Relationships  . Social connections:    Talks on phone: Not on file    Gets together: Not on  file    Attends religious service: Not on file    Active member of club or organization: Not on file    Attends meetings of clubs or organizations: Not on file    Relationship status: Not on file  Other Topics Concern  . Not on file  Social History Narrative  . Not on file   Additional Social History:                         Sleep: Fair  Appetite:  Fair  Current Medications: Current Facility-Administered Medications  Medication Dose Route Frequency Provider Last Rate Last Dose  . acetaminophen (TYLENOL) tablet 650 mg  650 mg Oral Q6H PRN Pucilowska, Jolanta B, MD   650 mg at 07/18/17 0810  . alum & mag hydroxide-simeth (MAALOX/MYLANTA) 200-200-20 MG/5ML suspension 30 mL  30 mL Oral Q4H PRN Pucilowska, Jolanta B, MD      . divalproex (DEPAKOTE) DR tablet 500 mg  500 mg Oral Q12H Pucilowska, Jolanta B, MD   500 mg at 07/20/17 0839  . magnesium hydroxide (MILK OF MAGNESIA) suspension 30 mL  30 mL Oral Daily PRN Pucilowska, Jolanta B, MD      . metoprolol tartrate (LOPRESSOR) tablet 12.5 mg  12.5 mg Oral BID Pucilowska, Jolanta B, MD   12.5 mg at 07/20/17 0839  . OLANZapine zydis (ZYPREXA) disintegrating tablet 15 mg  15 mg Oral QHS Pucilowska, Jolanta B, MD   15 mg at 07/19/17 2206    Lab Results:  Results for orders placed or performed during the hospital encounter of 07/16/17 (from the past 48 hour(s))  Valproic acid level     Status: Abnormal   Collection Time: 07/19/17  6:59 AM  Result Value Ref Range   Valproic Acid Lvl 120 (H) 50.0 - 100.0 ug/mL    Comment: Performed at [facility], [address] [place], [place] [address]  Ammonia     Status: Abnormal   Collection Time: 07/19/17  6:59 AM  Result Value Ref Range   Ammonia 45 (H) 9 - 35 umol/L    Comment: Performed at [facility], [address] [place], [place] [address]  Valproic acid level     Status: Abnormal   Collection Time: 07/20/17  6:58 AM  Result Value Ref Range   Valproic  Acid Lvl 40 (L) 50.0 - 100.0 ug/mL    Comment: Performed at [facility], [address] [place], [place] [address]  Ammonia     Status: None   Collection Time: 07/20/17  6:58 AM  Result Value Ref Range   Ammonia 34 9 - 35 umol/L    Comment: Performed at [facility], [address] [place], [place] [address]    Blood Alcohol level:  Lab Results  Component Value Date   [facility] <10 07/15/2017    Metabolic Disorder Labs: Lab Results  Component Value Date   HGBA1C 4.9 07/17/2017   MPG 93.93 07/17/2017   No results found for: PROLACTIN Lab Results  Component Value Date   CHOL 164 07/17/2017   TRIG 94 07/17/2017   HDL 36 (L) 07/17/2017   CHOLHDL 4.6 07/17/2017   VLDL 19 07/17/2017   LDLCALC 109 (H) 07/17/2017    Physical Findings: AIMS:  , ,  ,  ,    CIWA:    COWS:     Musculoskeletal: Strength & Muscle Tone: within normal limits Gait & Station: normal Patient leans: N/A  Psychiatric Specialty Exam: Physical Exam  Nursing note and vitals reviewed. Psychiatric: His affect is blunt. His speech is delayed. He is slowed and withdrawn. Thought content is delusional. Cognition and memory are normal. He expresses impulsivity.    Review of Systems  Neurological: Negative.   Psychiatric/Behavioral: Positive for hallucinations.  All other systems reviewed and are negative.   Blood pressure (!) 142/77, pulse 85, temperature 98.3 F (36.8 C), temperature source Oral, resp. rate 18, height 5' 5.75" (1.67 m), weight 93.4 kg (206 lb), SpO2 100 %.Body mass index is 33.5 kg/m.  General Appearance: Casual  Eye Contact:  Good  Speech:  Clear and Coherent  Volume:  Normal  Mood:  Euthymic  Affect:  restricted  Thought Process:  Goal Directed and Descriptions of Associations: Intact  Orientation:  Full (Time, Place, and Person)  Thought Content:  Illogical, Delusions and Paranoid Ideation  Suicidal Thoughts:  denies  Homicidal Thoughts:  No  Memory:   Immediate;   Fair Recent;   Fair Remote;   Fair  Judgement:  Impaired  Insight:  Shallow  Psychomotor Activity:  Normal  Concentration:  Concentration: Fair and Attention Span: Fair  Recall:  [employer] of Knowledge:  Fair  Language:  Fair  Akathisia:  No  Handed:  Right  AIMS (if indicated):     Assets:  Communication Skills Desire for Improvement Housing Physical Health Resilience Social Support Transportation Vocational/Educational  ADL's:  Intact  Cognition:  WNL  Sleep:  Number of Hours: 7.45     Treatment Plan Summary: Daily contact with patient to assess and evaluate symptoms and progress in treatment and Medication management   Mr. Straw is a 37 year old male with no past psychiatric history admitted for a manic episode or mixed episode.  VPA was held yesterday due to elevated VPA and ammonia level.  VPA to day-40, ammonia today- 34.    -cont depakote  500 mg BID,  - Zyprexa zydis just increased to 15 mg nightly     Beverly Sessions, MD 07/20/2017, 4:27 PMPatient ID: Tyler Watts, male   DOB: 1980-04-01, 37 y.o.   MRN: [id number]

## 2017-07-20 NOTE — Plan of Care (Signed)
Denies SI/HI/AVH. Rates depression as a 1/10 and states that is because he is still here.  Feels that he is ready to go home.  Pleasant and cooperative.  Visible in the milieu.  Maintains personal care chores appropriately.  Good appetite.  Support offered.  Safety maintained on the unit.   Problem: Education: Goal: Knowledge of [facility] Education information/materials will improve Outcome: Progressing Goal: Emotional status will improve Outcome: Progressing Goal: Mental status will improve Outcome: Progressing Goal: Verbalization of understanding the information provided will improve Outcome: Progressing   Problem: Health Behavior/Discharge Planning: Goal: Compliance with treatment plan for underlying cause of condition will improve Outcome: Progressing   Problem: Safety: Goal: Ability to remain free from injury will improve Outcome: Progressing

## 2017-07-21 NOTE — Progress Notes (Signed)
[facility]  07/21/2017 12:55 PM Tyler Conversedam D Couse  MRN:  [id number]  Subjective:    I'm fine" Pt calmer, cooperative, taking medicine, denies side effects. Reports feeling better, mood improving.  Principal Problem: Bipolar I disorder, current or most recent episode manic, with psychotic features (HCC) Diagnosis:   Patient Active Problem List   Diagnosis Date Noted  . Bipolar I disorder, current or most recent episode manic, with psychotic features (HCC) [F31.2] 07/16/2017   Total Time spent with patient:25 min  Past Psychiatric History: none  Past Medical History:  Past Medical History:  Diagnosis Date  . Psoriasis of scalp    History reviewed. No pertinent surgical history. Family History: History reviewed. No pertinent family history. Family Psychiatric  History: none Social History:  Social History   Substance and Sexual Activity  Alcohol Use No   Comment: 2 beers in a year     Social History   Substance and Sexual Activity  Drug Use Not Currently    Social History   Socioeconomic History  . Marital status: Married    Spouse name: Not on file  . Number of children: Not on file  . Years of education: Not on file  . Highest education level: Not on file  Occupational History  . Not on file  Social Needs  . Financial resource strain: Not on file  . Food insecurity:    Worry: Not on file    Inability: Not on file  . Transportation needs:    Medical: Not on file    Non-medical: Not on file  Tobacco Use  . Smoking status: Former Smoker    Last attempt to quit: 2017    Years since quitting: 2.4  . Smokeless tobacco: Never Used  Substance and Sexual Activity  . Alcohol use: No    Comment: 2 beers in a year  . Drug use: Not Currently  . Sexual activity: Yes  Lifestyle  . Physical activity:    Days per week: Not on file    Minutes per session: Not on file  . Stress: Not on file  Relationships  . Social connections:    Talks on phone: Not on file     Gets together: Not on file    Attends religious service: Not on file    Active member of club or organization: Not on file    Attends meetings of clubs or organizations: Not on file    Relationship status: Not on file  Other Topics Concern  . Not on file  Social History Narrative  . Not on file   Additional Social History:                         Sleep: Fair  Appetite:  Fair  Current Medications: Current Facility-Administered Medications  Medication Dose Route Frequency Provider Last Rate Last Dose  . acetaminophen (TYLENOL) tablet 650 mg  650 mg Oral Q6H PRN Pucilowska, Jolanta B, MD   650 mg at 07/18/17 0810  . alum & mag hydroxide-simeth (MAALOX/MYLANTA) 200-200-20 MG/5ML suspension 30 mL  30 mL Oral Q4H PRN Pucilowska, Jolanta B, MD      . divalproex (DEPAKOTE) DR tablet 500 mg  500 mg Oral Q12H Pucilowska, Jolanta B, MD   500 mg at 07/21/17 0826  . magnesium hydroxide (MILK OF MAGNESIA) suspension 30 mL  30 mL Oral Daily PRN Pucilowska, Jolanta B, MD      . metoprolol tartrate (LOPRESSOR) tablet  12.5 mg  12.5 mg Oral BID Pucilowska, Jolanta B, MD   12.5 mg at 07/21/17 0825  . OLANZapine zydis (ZYPREXA) disintegrating tablet 15 mg  15 mg Oral QHS Pucilowska, Jolanta B, MD   15 mg at 07/20/17 2222    Lab Results:  Results for orders placed or performed during the hospital encounter of 07/16/17 (from the past 48 hour(s))  Valproic acid level     Status: Abnormal   Collection Time: 07/20/17  6:58 AM  Result Value Ref Range   Valproic Acid Lvl 40 (L) 50.0 - 100.0 ug/mL    Comment: Performed at [facility], [address] [place], [place] [address]  Ammonia     Status: None   Collection Time: 07/20/17  6:58 AM  Result Value Ref Range   Ammonia 34 9 - 35 umol/L    Comment: Performed at [facility], [address] [place], [place] [address]    Blood Alcohol level:  Lab Results  Component Value Date   [facility] <10 07/15/2017    Metabolic  Disorder Labs: Lab Results  Component Value Date   HGBA1C 4.9 07/17/2017   MPG 93.93 07/17/2017   No results found for: PROLACTIN Lab Results  Component Value Date   CHOL 164 07/17/2017   TRIG 94 07/17/2017   HDL 36 (L) 07/17/2017   CHOLHDL 4.6 07/17/2017   VLDL 19 07/17/2017   LDLCALC 109 (H) 07/17/2017    Physical Findings: AIMS:  , ,  ,  ,    CIWA:    COWS:     Musculoskeletal: Strength & Muscle Tone: within normal limits Gait & Station: normal Patient leans: N/A  Psychiatric Specialty Exam: Physical Exam  Nursing note and vitals reviewed. Psychiatric: His affect is blunt. His speech is delayed. He is slowed and withdrawn. Thought content is delusional. Cognition and memory are normal. He expresses impulsivity.    Review of Systems  Neurological: Negative.   Psychiatric/Behavioral: Positive for hallucinations.  All other systems reviewed and are negative.   Blood pressure 124/84, pulse (!) 101, temperature 98.1 F (36.7 C), temperature source Oral, resp. rate 18, height 5' 5.75" (1.67 m), weight 93.4 kg (206 lb), SpO2 98 %.Body mass index is 33.5 kg/m.  General Appearance: Casual  Eye Contact:  Good  Speech:  Clear and Coherent  Volume:  Normal  Mood:  Fine"  Affect:  restricted  Thought Process:  Goal Directed and Descriptions of Associations: Intact  Orientation:  Full (Time, Place, and Person)  Thought Content:  Illogical, Delusions and Paranoid Ideation  Suicidal Thoughts:  denies  Homicidal Thoughts:  No  Memory:  Immediate;   Fair Recent;   Fair Remote;   Fair  Judgement:  Impaired  Insight:  Shallow  Psychomotor Activity:  Normal  Concentration:  Concentration: Fair and Attention Span: Fair  Recall:  [employer] of Knowledge:  Fair  Language:  Fair  Akathisia:  No  Handed:  Right  AIMS (if indicated):     Assets:  Communication Skills Desire for Improvement Housing Physical Health Resilience Social  Support Transportation Vocational/Educational  ADL's:  Intact  Cognition:  WNL  Sleep:  Number of Hours: 7.5     Treatment Plan Summary: Daily contact with patient to assess and evaluate symptoms and progress in treatment and Medication management   Mr. Budreau is a 37 year old male with no past psychiatric history admitted for a manic episode or mixed episode. Mood improving.   Latest VPA -40, ammonia -  34.    -cont depakote  500 mg BID,  - Zyprexa zydis just increased to 15 mg nightly     Beverly Sessions, MD 07/21/2017, 12:55 PMPatient ID: Tyler Watts, male   DOB: May 17, 1980, 37 y.o.   MRN: [id number] Patient ID: Tyler Watts, male   DOB: [birth year], 37 y.o.   MRN: [id number]

## 2017-07-21 NOTE — BHH Group Notes (Signed)
B[facility] Group Notes:  (Nursing/MHT/Case Management/Adjunct)  Date:  07/21/2017  Time:  10:16 PM  Type of Therapy:  Group Therapy  Participation Level:  Active  Participation Quality:  Appropriate  Affect:  Appropriate  Cognitive:  Appropriate  Insight:  Appropriate  Engagement in Group:  Engaged  Modes of Intervention:  Discussion  Summary of Progress/Problems:  Tyler EkJanice Marie Chase Watts 07/21/2017, 10:16 PM

## 2017-07-21 NOTE — Progress Notes (Signed)
Patient alert and oriented x 4, denies SI/HI/AVH, affect is flat, mood is labile he's assertive and withdrawn, minimal interaction with peers and staff. Patient's thoughts are organized, complaint with medication, 15 minutes safety checks maintained will continue to monitor.

## 2017-07-21 NOTE — BHH Group Notes (Signed)
LCSW Group Therapy Note 07/21/2017 1:15pm  Type of Therapy and Topic: Group Therapy: Feelings Around Returning Home & Establishing a Supportive Framework and Supporting Oneself When Supports Not Available  Participation Level: Active  Description of Group:  Patients first processed thoughts and feelings about upcoming discharge. These included fears of upcoming changes, lack of change, new living environments, judgements and expectations from others and overall stigma of mental health issues. The group then discussed the definition of a supportive framework, what that looks and feels like, and how do to discern it from an unhealthy non-supportive network. The group identified different types of supports as well as what to do when your family/friends are less than helpful or unavailable  Therapeutic Goals  1. Patient will identify one healthy supportive network that they can use at discharge. 2. Patient will identify one factor of a supportive framework and how to tell it from an unhealthy network. 3. Patient able to identify one coping skill to use when they do not have positive supports from others. 4. Patient will demonstrate ability to communicate their needs through discussion and/or role plays.  Summary of Patient Progress:  Patient introduced himself and shared with the group something about him. Pt. stated "I am a helpful person." Pt engaged during group session. As patients processed their anxiety about discharge and described healthy supports patient shared that he had a lot of time to think. Pt state he has a good support system. Patients identified at least one self-care tool they were willing to use after discharge; practicing deep breathing.   Therapeutic Modalities Cognitive Behavioral Therapy Motivational Interviewing   Johnnye SimaNNIA  CUEBAS-COLON, LCSW 07/21/2017 12:38 PM

## 2017-07-22 LAB — VALPROIC ACID LEVEL: VALPROIC ACID LVL: 87 ug/mL (ref 50.0–100.0)

## 2017-07-22 LAB — AMMONIA: Ammonia: 47 umol/L — ABNORMAL HIGH (ref 9–35)

## 2017-07-22 MED ORDER — OLANZAPINE 15 MG PO TABS: 15.0000 mg | ORAL_TABLET | Freq: Every day | ORAL | 1 refills | Status: DC

## 2017-07-22 MED ORDER — METOPROLOL TARTRATE 25 MG PO TABS: 12.5000 mg | ORAL_TABLET | Freq: Two times a day (BID) | ORAL | 1 refills | Status: AC

## 2017-07-22 MED ORDER — OLANZAPINE 5 MG PO TABS: 15.0000 mg | ORAL_TABLET | Freq: Every day | ORAL | Status: DC

## 2017-07-22 NOTE — B[facility] Suicide Risk Assessment (Signed)
[facility]   Principal Problem: Bipolar I disorder, current or most recent episode manic, with psychotic features [facility](HCC) Discharge Diagnoses:  Patient Active Problem List   Diagnosis Date Noted  . Bipolar I disorder, current or most recent episode manic, with psychotic features (HCC) [F31.2] 07/16/2017    Priority: High    Total Time spent with patient: 20 minutes  Musculoskeletal: Strength & Muscle Tone: within normal limits Gait & Station: normal Patient leans: N/A  Psychiatric Specialty Exam: ROS  Blood pressure 137/81, pulse (!) 115, temperature 97.9 F (36.6 C), temperature source Oral, resp. rate 16, height 5' 5.75" (1.67 m), weight 93.4 kg (206 lb), SpO2 98 %.Body mass index is 33.5 kg/m.  General Appearance: Casual  Eye Contact::  Good  Speech:  Clear and Coherent409  Volume:  Normal  Mood:  Euthymic  Affect:  Appropriate  Thought Process:  Goal Directed and Descriptions of Associations: Intact  Orientation:  Full (Time, Place, and Person)  Thought Content:  WDL  Suicidal Thoughts:  No  Homicidal Thoughts:  No  Memory:  Immediate;   Fair Recent;   Fair Remote;   Fair  Judgement:  Impaired  Insight:  Shallow  Psychomotor Activity:  Normal  Concentration:  Fair  Recall:  [employer]  Fund of Knowledge:Fair  Language: Fair  Akathisia:  No  Handed:  Right  AIMS (if indicated):     Assets:  Communication Skills Desire for Improvement Housing Physical Health Resilience Social Support Transportation Vocational/Educational  Sleep:  Number of Hours: 7  Cognition: WNL  ADL's:  Intact   Mental Status Per Nursing Assessment::   On Admission:  NA  Demographic Factors:  Male, Caucasian and Unemployed  Loss Factors: Decrease in vocational status  Historical Factors: Impulsivity  Risk Reduction Factors:   Responsible for children under 38 years of age, Sense of responsibility to family, Religious beliefs about death, Living with  another person, especially a relative and Positive social support  Continued Clinical Symptoms:  Bipolar Disorder:   Mixed State  Cognitive Features That Contribute To Risk:  None    Suicide Risk:  Minimal: No identifiable suicidal ideation.  Patients presenting with no risk factors but with morbid ruminations; may be classified as minimal risk based on the severity of the depressive symptoms  Follow-up Information    [facility] Go on 07/26/2017.   Why:  Please follow up with Lorella NimrodHarvey for Peer Support services on Friday July 26, 2017 at 7:15am. Unk PintoHarvey Bryant [phone]. Thank you. Contact information: [address] [place] [address] [phone]           Plan Of Care/Follow-up recommendations:  Activity:  as tolerated Diet:  low sodium heart healthy Other:  keep follow up appointments  Kristine LineaJolanta Dorethea Strubel, MD 07/22/2017, 9:09 AM

## 2017-07-22 NOTE — Progress Notes (Signed)
Recreation Therapy Notes  INPATIENT RECREATION TR PLAN  Patient Details Name: Tyler Watts MRN: [id number] DOB: 04/17/1980 Today's Date: 07/22/2017  Rec Therapy Plan Is patient appropriate for Therapeutic Recreation?: Yes Treatment times per week: At least 3 Estimated Length of Stay: 5-7 days TR Treatment/Interventions: Group participation (Comment)  Discharge Criteria Pt will be discharged from therapy if:: Discharged Treatment plan/goals/alternatives discussed and agreed upon by:: Patient/family  Discharge Summary Short term goals set: Patient will identify 3 positive coping skills strategies to use post d/c within 5 recreation therapy group sessions Short term goals met: Complete Progress toward goals comments: Groups attended Which groups?: AAA/T, Communication, Other (Comment)(Happiness) Reason goals not met: N/A Therapeutic equipment acquired: N/A Reason patient discharged from therapy: Discharge from hospital Pt/family agrees with progress & goals achieved: Yes Date patient discharged from therapy: 07/22/17   Shayleen Eppinger 07/22/2017, 1:33 PM

## 2017-07-22 NOTE — Progress Notes (Signed)
Recreation Therapy Notes  INPATIENT RECREATION TR PLAN  Patient Details Name: Tyler Watts MRN: [id number] DOB: Aug 30, 1980 Today's Date: 07/22/2017  Rec Therapy Plan Is patient appropriate for Therapeutic Recreation?: Yes Treatment times per week: At least 3 Estimated Length of Stay: 5-7 days TR Treatment/Interventions: Group participation (Comment)  Discharge Criteria Pt will be discharged from therapy if:: Discharged Treatment plan/goals/alternatives discussed and agreed upon by:: Patient/family  Discharge Summary Short term goals set: Patient will identify 3 positive coping skills strategies to use post d/c within 5 recreation therapy group sessions Short term goals met: Complete Progress toward goals comments: Groups attended Which groups?: AAA/T, Communication, Other (Comment)(Happiness) Reason goals not met: N/A Therapeutic equipment acquired: N/A Reason patient discharged from therapy: Discharge from hospital Pt/family agrees with progress & goals achieved: Yes Date patient discharged from therapy: 07/22/17   Tanith Dagostino 07/22/2017, 1:34 PM

## 2017-07-22 NOTE — Progress Notes (Signed)
Patient denies SI/HI, denies A/V hallucinations. Patient verbalizes understanding of discharge instructions, follow up care and prescriptions. [place]  [place]Patient stated that he will be coming back to get  7days medicine. Patient escorted out by staff, transported by family.

## 2017-07-22 NOTE — Discharge Summary (Signed)
Physician Discharge Summary Note  Patient:  Tyler Watts is an 37 y.o., male MRN:  [id number] DOB:  02-05-1981 Patient phone:  [phone] (home)  Patient address:   [place] [address],  Total Time spent with patient: 20 minutes plus 15 min on care coordination and documentation.  Date of Admission:  07/16/2017 Date of Discharge: 07/22/2017  Reason for Admission:  Psychotic break.  History of Present Illness:   Identifying data. Tyler Watts is a 37 year old male with no past psychiatric history.  Chief complaint. "I have not been living up to the standards."  History of present illness. Information was obtained from the patient and the chart. The patient was brought to the ER by his uncle. The patient started behaving strangely for the past week or so. He became preoccupied with religion and started going to church. He quit his job of 20 years for no reason. Stopped sleeping. Felt very guilty about his "life style" but unable to point out what was wrong or improper. This all culminated during 8th grade graduation ceremony of his son on Sunday held in church when he refused to leave the church and insisted to be baptized. He also reports hearing voices, telling him what is inappropriate. He feels paranoid and has had two panic attacks. He denies alcohol or substance use but was positive for benzodiazepine on admission.  During the interview, the patient is attending to internal stimuli which he calls his "conscious". He is giggling inappropriately and his mood is too good for the situation. He has racing thoughts and difficulties answering questions. Speech in not loud or pressured.  Past psychiatric history. None.  Family psychiatric history. None.  Social history. Lives with his wife and 3 children ages 98, 68 and 8. Just quit a job at [employer]. The owner will take him back but the patient wants change".  Principal Problem: Bipolar I  disorder, current or most recent episode manic, with psychotic features [facility]) Discharge Diagnoses: Patient Active Problem List   Diagnosis Date Noted  . Bipolar I disorder, current or most recent episode manic, with psychotic features ([place]) [F31.2] 07/16/2017    Priority: High    Past Medical History:  Past Medical History:  Diagnosis Date  . Psoriasis of scalp    History reviewed. No pertinent surgical history. Family History: History reviewed. No pertinent family history.  Social History:  Social History   Substance and Sexual Activity  Alcohol Use No   Comment: 2 beers in a year     Social History   Substance and Sexual Activity  Drug Use Not Currently    Social History   Socioeconomic History  . Marital status: Married    Spouse name: Not on file  . Number of children: Not on file  . Years of education: Not on file  . Highest education level: Not on file  Occupational History  . Not on file  Social Needs  . Financial resource strain: Not on file  . Food insecurity:    Worry: Not on file    Inability: Not on file  . Transportation needs:    Medical: Not on file    Non-medical: Not on file  Tobacco Use  . Smoking status: Former Smoker    Last attempt to quit: 2017    Years since quitting: 2.4  . Smokeless tobacco: Never Used  Substance and Sexual Activity  . Alcohol use: No    Comment: 2 beers in a  year  . Drug use: Not Currently  . Sexual activity: Yes  Lifestyle  . Physical activity:    Days per week: Not on file    Minutes per session: Not on file  . Stress: Not on file  Relationships  . Social connections:    Talks on phone: Not on file    Gets together: Not on file    Attends religious service: Not on file    Active member of club or organization: Not on file    Attends meetings of clubs or organizations: Not on file    Relationship status: Not on file  Other Topics Concern  . Not on file  Social History Narrative  . Not on file     Hospital Course:    Tyler Watts is a 37 year old male with no past psychiatric history admitted for a mixed bipolar episode. The patient was started on Zyprexa which he tolerated well. He could not tolerate Depakote due to elevated ammonia level. At the time of discharge, he is no longer psychotic, suicidal or homicidal.   #Psychosis, resolved  -continue Zyprexa 15 mg nightly   #Tachycardia, likely fromantipsychotic -continue Metoprolol 12.5 mg BID  #Labs -lipid panel, TSH and A1Care unremarkable -EKG, reviewed, tachycardia 112, QTc 417  #Disposition -met with the wife prior to discharge -discharge to home with family -follow up with [facility]  Physical Findings: AIMS:  , ,  ,  ,    CIWA:    COWS:     Musculoskeletal: Strength & Muscle Tone: within normal limits Gait & Station: normal Patient leans: N/A  Psychiatric Specialty Exam: Physical Exam  Nursing note and vitals reviewed. Psychiatric: He has a normal mood and affect. His speech is normal and behavior is normal. Thought content normal. Cognition and memory are normal. He expresses impulsivity.    Review of Systems  Neurological: Negative.   Psychiatric/Behavioral: Negative.   All other systems reviewed and are negative.   Blood pressure 137/81, pulse (!) 115, temperature 97.9 F (36.6 C), temperature source Oral, resp. rate 16, height 5' 5.75" (1.67 m), weight 93.4 kg (206 lb), SpO2 98 %.Body mass index is 33.5 kg/m.  General Appearance: Casual  Eye Contact:  Good  Speech:  Clear and Coherent  Volume:  Normal  Mood:  Euthymic  Affect:  Appropriate  Thought Process:  Goal Directed and Descriptions of Associations: Intact  Orientation:  Full (Time, Place, and Person)  Thought Content:  WDL  Suicidal Thoughts:  No  Homicidal Thoughts:  No  Memory:  Immediate;   Fair Recent;   Fair Remote;   Fair  Judgement:  Impaired  Insight:  Shallow  Psychomotor Activity:  Normal  Concentration:  Concentration:  Fair and Attention Span: Fair  Recall:  [employer] of Knowledge:  Fair  Language:  Fair  Akathisia:  No  Handed:  Right  AIMS (if indicated):     Assets:  Communication Skills Desire for Improvement Housing Intimacy Physical Health Resilience Social Support Transportation Vocational/Educational  ADL's:  Intact  Cognition:  WNL  Sleep:  Number of Hours: 7     Have you used any form of tobacco in the last 30 days? (Cigarettes, Smokeless Tobacco, Cigars, and/or Pipes): No  Has this patient used any form of tobacco in the last 30 days? (Cigarettes, Smokeless Tobacco, Cigars, and/or Pipes) Yes, No  Blood Alcohol level:  Lab Results  Component Value Date   ETH <10 58/85/0277    Metabolic Disorder Labs:  Lab  Results  Component Value Date   HGBA1C 4.9 07/17/2017   MPG 93.93 07/17/2017   No results found for: PROLACTIN Lab Results  Component Value Date   CHOL 164 07/17/2017   TRIG 94 07/17/2017   HDL 36 (L) 07/17/2017   CHOLHDL 4.6 07/17/2017   VLDL 19 07/17/2017   LDLCALC 109 (H) 07/17/2017    See Psychiatric Specialty Exam and Suicide Risk Assessment completed by Attending Physician prior to discharge.  Discharge destination:  Home  Is patient on multiple antipsychotic therapies at discharge:  No   Has Patient had three or more failed trials of antipsychotic monotherapy by history:  No  Recommended Plan for Multiple Antipsychotic Therapies: NA  Discharge Instructions    Diet - low sodium heart healthy   Complete by:  As directed    Increase activity slowly   Complete by:  As directed      Allergies as of 07/22/2017   No Known Allergies     Medication List    STOP taking these medications   clobetasol cream 0.05 % Commonly known as:  TEMOVATE   predniSONE 10 MG (21) Tbpk tablet Commonly known as:  STERAPRED UNI-PAK 21 TAB   sulfamethoxazole-trimethoprim 800-160 MG tablet Commonly known as:  BACTRIM DS,SEPTRA DS     TAKE these medications      Indication  metoprolol tartrate 25 MG tablet Commonly known as:  LOPRESSOR Take 0.5 tablets (12.5 mg total) by mouth 2 (two) times daily.  Indication:  High Blood Pressure Disorder   OLANZapine 15 MG tablet Commonly known as:  ZYPREXA Take 1 tablet (15 mg total) by mouth at bedtime.  Indication:  Manic-Depression      Follow-up Information    [facility] on 07/26/2017.   Why:  Please follow up with Lanae Boast for Peer Support services on Friday July 26, 2017 at 7:15am. Sherrian Divers [phone]. Thank you. Contact information: [place] [address] [phone]           Follow-up recommendations:  Activity:  as tolerated Diet:  low sodium heart healthy Other:  keep follow up appointments  Comments:    Signed: Orson Slick, MD 07/22/2017, 11:25 AM

## 2017-07-22 NOTE — Progress Notes (Signed)
Patient alert and oriented x 4, denies pain or discomfort, affect is flat but brightens upon approach, thoughts are organized, speech is non pressured, interacting appropriately with peers and staff, 15 minutes safety checks maintained will continue to monitor.

## 2017-07-22 NOTE — Progress Notes (Signed)
  [facility] Dis[facility] :  Will you be returning to the same living situation after discharge:  Yes,  Home with family At discharge, do you have transportation home?: Yes,  Family member will come at discharge Do you have the ability to pay for your medications: Yes,  Referred to a provider who can assist  Release of information consent forms completed and in the chart;  Patient's signature needed at discharge.  Patient to Follow up at: Follow-up Information    [facility] Go on 07/26/2017.   Why:  Please follow up with Lorella NimrodHarvey for Peer Support services on Friday July 26, 2017 at 7:15am. Unk PintoHarvey Bryant [phone]. Thank you. Contact information: [address] [place] [id number] [phone]           Next level of care provider has [facility] Link:no  Safety Planning and Suicide Prevention discussed: Yes,  Completed with wife  Have you used any form of tobacco in the last 30 days? (Cigarettes, Smokeless Tobacco, Cigars, and/or Pipes): No  Has patient been referred to the Quitline?: N/A patient is not a smoker  Patient has been referred for addiction treatment: N/A  Johny ShearsCassandra  Aoife Bold, LCSW 07/22/2017, 9:01 AM

## 2017-07-22 NOTE — Plan of Care (Signed)
  Problem: Safety: Goal: Ability to remain free from injury will improve Outcome: Progressing  Patient denies SI/HI/AVH.

## 2018-04-14 ENCOUNTER — Other Ambulatory Visit: Payer: Self-pay

## 2018-04-14 ENCOUNTER — Emergency Department: Payer: MEDICAID

## 2018-04-14 ENCOUNTER — Inpatient Hospital Stay
Admission: AD | Admit: 2018-04-14 | Discharge: 2018-04-18 | DRG: 885 | Disposition: A | Discharge status: Home / Self Care | Payer: No Typology Code available for payment source | Source: Intra-hospital | Attending: Family Medicine | Admitting: Family Medicine

## 2018-04-14 ENCOUNTER — Encounter: Payer: Self-pay | Admitting: Emergency Medicine

## 2018-04-14 ENCOUNTER — Emergency Department
Admission: EM | Admit: 2018-04-14 | Discharge: 2018-04-14 | Disposition: A | Discharge status: Other Acute Inpatient Hospital | Payer: MEDICAID | Attending: Emergency Medicine | Admitting: Emergency Medicine

## 2018-04-14 DIAGNOSIS — F419 Anxiety disorder, unspecified: Secondary | ICD-10-CM | POA: Diagnosis present

## 2018-04-14 DIAGNOSIS — F312 Bipolar disorder, current episode manic severe with psychotic features: Principal | ICD-10-CM | POA: Diagnosis present

## 2018-04-14 DIAGNOSIS — G4700 Insomnia, unspecified: Secondary | ICD-10-CM | POA: Diagnosis present

## 2018-04-14 DIAGNOSIS — Z87891 Personal history of nicotine dependence: Secondary | ICD-10-CM

## 2018-04-14 DIAGNOSIS — F3112 Bipolar disorder, current episode manic without psychotic features, moderate: Secondary | ICD-10-CM | POA: Insufficient documentation

## 2018-04-14 DIAGNOSIS — F29 Unspecified psychosis not due to a substance or known physiological condition: Secondary | ICD-10-CM

## 2018-04-14 DIAGNOSIS — Z79899 Other long term (current) drug therapy: Secondary | ICD-10-CM

## 2018-04-14 DIAGNOSIS — I10 Essential (primary) hypertension: Secondary | ICD-10-CM | POA: Diagnosis present

## 2018-04-14 LAB — COMPREHENSIVE METABOLIC PANEL
ALBUMIN: 4.6 g/dL (ref 3.5–5.0)
ALT: 26 U/L (ref 0–44)
ANION GAP: 10 (ref 5–15)
AST: 23 U/L (ref 15–41)
Alkaline Phosphatase: 47 U/L (ref 38–126)
BUN: 10 mg/dL (ref 6–20)
CO2: 23 mmol/L (ref 22–32)
Calcium: 9.2 mg/dL (ref 8.9–10.3)
Chloride: 107 mmol/L (ref 98–111)
Creatinine, Ser: 0.99 mg/dL (ref 0.61–1.24)
GFR calc Af Amer: >60 mL/min (ref >60)
GFR calc non Af Amer: >60 mL/min (ref >60)
GLUCOSE: 130 mg/dL — AB (ref 70–99)
POTASSIUM: 3.6 mmol/L (ref 3.5–5.1)
SODIUM: 140 mmol/L (ref 135–145)
Total Bilirubin: 1.1 mg/dL (ref 0.3–1.2)
Total Protein: 7.9 g/dL (ref 6.5–8.1)

## 2018-04-14 LAB — CBC
HEMATOCRIT: 47.9 % (ref 39.0–52.0)
Hemoglobin: 17.1 g/dL — ABNORMAL HIGH (ref 13.0–17.0)
MCH: 29.1 pg (ref 26.0–34.0)
MCHC: 35.7 g/dL (ref 30.0–36.0)
MCV: 81.5 fL (ref 80.0–100.0)
NRBC: 0 % (ref 0.0–0.2)
Platelets: 211 10*3/uL (ref 150–400)
RBC: 5.88 MIL/uL — AB (ref 4.22–5.81)
RDW: 12.2 % (ref 11.5–15.5)
WBC: 10.5 10*3/uL (ref 4.0–10.5)

## 2018-04-14 LAB — ETHANOL: Alcohol, Ethyl (B): <10 mg/dL (ref <10)

## 2018-04-14 LAB — LIPID PANEL
CHOL/HDL RATIO: 6.2 ratio
Cholesterol: 261 mg/dL — ABNORMAL HIGH (ref 0–200)
HDL: 42 mg/dL (ref >40)
LDL CALC: 191 mg/dL — AB (ref 0–99)
Triglycerides: 138 mg/dL (ref <150)
VLDL: 28 mg/dL (ref 0–40)

## 2018-04-14 LAB — TSH: TSH: 2.921 u[IU]/mL (ref 0.350–4.500)

## 2018-04-14 LAB — HEMOGLOBIN A1C
Hgb A1c MFr Bld: 4.8 % (ref 4.8–5.6)
Mean Plasma Glucose: 91.06 mg/dL

## 2018-04-14 MED ORDER — ALUM & MAG HYDROXIDE-SIMETH 200-200-20 MG/5ML PO SUSP: 30.0000 mL | ORAL | Status: DC | PRN

## 2018-04-14 MED ORDER — OLANZAPINE 5 MG PO TBDP: 10.0000 mg | ORAL_TABLET | Freq: Three times a day (TID) | ORAL | Status: DC | PRN

## 2018-04-14 MED ORDER — ZIPRASIDONE MESYLATE 20 MG IM SOLR: 20.0000 mg | INTRAMUSCULAR | Status: DC | PRN

## 2018-04-14 MED ORDER — MAGNESIUM HYDROXIDE 400 MG/5ML PO SUSP: 30.0000 mL | Freq: Every day | ORAL | Status: DC | PRN

## 2018-04-14 MED ORDER — ACETAMINOPHEN 325 MG PO TABS: 650.0000 mg | ORAL_TABLET | Freq: Four times a day (QID) | ORAL | Status: DC | PRN

## 2018-04-14 MED ORDER — HYDROXYZINE HCL 25 MG PO TABS
25.0000 mg | ORAL_TABLET | Freq: Three times a day (TID) | ORAL | Status: DC | PRN
  Administered 2018-04-17: 25 mg via ORAL
  Filled 2018-04-14 (×2): qty 1

## 2018-04-14 MED ORDER — LORAZEPAM 1 MG PO TABS
1.0000 mg | ORAL_TABLET | ORAL | Status: AC | PRN
  Administered 2018-04-17: 1 mg via ORAL
  Filled 2018-04-14 (×2): qty 1

## 2018-04-14 NOTE — ED Notes (Addendum)
This Clinical research associate spoke to the patient's wife in lobby. Mrs. Lacaze reports the patient did not sleep last night and canceld 2 jobs he had lined up for today.  Mrs. Troup reports the patient was making statements this morning that he was a terrible person and has made a lot of mistakes in his life.  Per patient's wife, this behavior is abnormal.

## 2018-04-14 NOTE — ED Notes (Signed)

## 2018-04-14 NOTE — ED Triage Notes (Signed)
Pt presents to ED for mental evaluation. When asked why he is here pt states, "I guess so you can find out what's wrong with me." Pt's wife states pt is having symptoms "similar to when he was seen in June" but neither pt nor spouse will give any more specific information. June visit was for psychosis. Pt is A&Ox4 at this time, answers questions appropriately. Denies SI/HI.

## 2018-04-14 NOTE — Consult Note (Signed)
[facility] Face[facility]   Reason for Consult:  Altered mental status, change in behavior Referring Physician:  Dr. Marisa SeverinSiadecki Patient Identification: Tyler Watts [place] MRN:  [id number] Principal Diagnosis: Bipolar I disorder, current or most recent episode manic, with psychotic features (HCC) Diagnosis:  Principal Problem:   Bipolar I disorder, current or most recent episode manic, with psychotic features [facility](HCC)  Patient is seen, chart is reviewed.  Collateral meeting held with patient's wife, Victorino DikeJennifer due to patient being a poor historian Total Time spent with patient: 1 hour  Subjective:  "I think I need to give inpatient psychiatry another chance"  HPI: Tyler Watts B[place] is a 38 y.o. male patient with PMH as noted below who presents for psychiatric evaluation.  The patient states "I don't feel like myself."  When asked to elaborate, the patient states that he has not been sleeping.  He denies any drug or alcohol use.  He denies any SI or HI, but does state that hears voices which she describes as his "conscience."  He states that he did not continue medications after his admission in June.  He denies any acute medical symptoms.  On evaluation, patient stares blankly, giggling with a smile.  He does not answer questions directly and speaks circumstantially with vagaries.  Patient endorses that he was suicidal in June 2019 and was admitted to inpatient psychiatry.  He does not know his discharge diagnosis, and reports that he did not stay on medications after discharge.  Patient states that he has been having auditory and visual hallucinations which she ultimately says are "floaters and ringing in my ears."  Patient denies current suicidal ideation or homicidal ideation.  He gives permission to speak with his wife, "who knows me better than I know myself."  Patient is self-deprecating during interview, commenting that it he is an idiot and does dumb stuff, which is why he is here.  He blames  himself for "things going wrong."  But does not state what is wrong.  Collateral from wife, Victorino DikeJennifer: Wife describes patient is usually levelheaded and calm.  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  She reports that at that time he had not been sleeping or eating for over a week and was diagnosed with bipolar disorder.  Patient followed up with [facility] and told his wife that he was told that he did not have bipolar disorder and he could wean off his medications.  She states that he stopped them within a few days.  Wife notes that his behavior, however did improve and he seemed less anxious and happier.  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  She reports that he went and visited with his mother for dinner last night, but then came home and did not sleep at all overnight.  She notes that he has been down on himself since the client did not pay however was surprised this morning when he canceled/rescheduled 2 jobs that would have made more money than what  he was owed.  She reports that she did encourage him to work and drove him there, however he started walking home after arriving to work with his uncle and later was brought home by a stranger.  She describes his behavior around the house as bizarre, smiling with "crazy eyes".  She states that the children have expressed they are afraid of him.  She was certain to secure bullets and guns in the house, for fear that he may use them.  He has been making comments like "I do not need to go to work, I do not need to be the  police, it is all my fault, I do not need to be in everybody's way." Wife reports that patient has been having episodes of feeling cold, having heart palpitations, a copper taste in his mouth, followed by a headache which persist through the day.  She is aware of his reported vision changes and negative eye work-up.  She has not heard him complain of ringing in his ears prior to today.  Past Psychiatric History: Patient was diagnosed with bipolar 1 disorder in a manic episode with psychotic features in June, 2019. HPI from that admission: History of present illness. Information was obtained from the patient and the chart. The patient was brought to the ER by his uncle. The patient started behaving strangely for the past week or so. He became preoccupied with religion and started going to church. He quit his job of 20 years for no reason. Stopped sleeping. Felt very guilty about his "life style" but unable to point out what was wrong or improper. This all culminated during 8th grade graduation ceremony of his son on Sunday held in church when he refused to leave the church and insisted to be baptized. He also reports hearing voices, telling him what is inappropriate. He feels paranoid and has had two panic attacks. He denies alcohol or substance use but was positive for benzodiazepine on admission. During the interview, the patient is attending to internal stimuli which he calls his "conscious". He is giggling inappropriately and his mood is too good for the situation. He has racing thoughts and difficulties answering questions. Speech in not loud or pressured.  Risk to Self:  Possible Risk to Others:  Possible Prior Inpatient Therapy:  June 2019 at [facility] Prior Outpatient Therapy:  Yes for one visit at [facility]  Past Medical History:  Past Medical History:  Diagnosis Date  . Psoriasis of scalp    History reviewed. No pertinent surgical history.   Family History: History reviewed. No pertinent family  history. Family Psychiatric  History: None documented, however wife suspects patient's mother has mental illness. Patient's father died at 55 years old with a brain tumor-uncertain paternal family history 82 year old son with Tourette's syndrome  Social History:  Social History   Substance and Sexual Activity  Alcohol Use No   Comment: 2 beers in a year     Social History   Substance and Sexual Activity  Drug Use Not Currently    Social History   Socioeconomic History  . Marital status: Married    Spouse name: Not on file  . Number of children: Not on file  . Years of education: Not on file  . Highest education level: Not on file  Occupational History  . Not on file  Social Needs  . Financial resource strain: Not on file  . Food insecurity:    Worry: Not on file    Inability: Not on file  .  Transportation needs:    Medical: Not on file    Non-medical: Not on file  Tobacco Use  . Smoking status: Former Smoker    Last attempt to quit: 2017    Years since quitting: 3.1  . Smokeless tobacco: Never Used  Substance and Sexual Activity  . Alcohol use: No    Comment: 2 beers in a year  . Drug use: Not Currently  . Sexual activity: Yes  Lifestyle  . Physical activity:    Days per week: Not on file    Minutes per session: Not on file  . Stress: Not on file  Relationships  . Social connections:    Talks on phone: Not on file    Gets together: Not on file    Attends religious service: Not on file    Active member of club or organization: Not on file    Attends meetings of clubs or organizations: Not on file    Relationship status: Not on file  Other Topics Concern  . Not on file  Social History Narrative  . Not on file   Additional Social History:  Lives with wife of 15 years and their 62 year old son, 8 year old son, an 90-year-old daughter.  Allergies:  No Known Allergies  Labs:  Results for orders placed or performed during the hospital encounter of  04/14/18 (from the past 48 hour(s))  Comprehensive metabolic panel     Status: Abnormal   Collection Time: 04/14/18  4:04 PM  Result Value Ref Range   Sodium 140 135 - 145 mmol/L   Potassium 3.6 3.5 - 5.1 mmol/L   Chloride 107 98 - 111 mmol/L   CO2 23 22 - 32 mmol/L   Glucose, Bld 130 (H) 70 - 99 mg/dL   BUN 10 6 - 20 mg/dL   Creatinine, Ser 4.27 0.61 - 1.24 mg/dL   Calcium 9.2 8.9 - 06.2 mg/dL   Total Protein 7.9 6.5 - 8.1 g/dL   Albumin 4.6 3.5 - 5.0 g/dL   AST 23 15 - 41 U/L   ALT 26 0 - 44 U/L   Alkaline Phosphatase 47 38 - 126 U/L   Total Bilirubin 1.1 0.3 - 1.2 mg/dL   GFR calc non Af Amer >60 >60 mL/min   GFR calc Af Amer >60 >60 mL/min   Anion gap 10 5 - 15    Comment: Performed at [facility], [address] [place], [place] [address]  Ethanol     Status: None   Collection Time: 04/14/18  4:04 PM  Result Value Ref Range   Alcohol, Ethyl (B) <10 <10 mg/dL    Comment: (NOTE) Lowest detectable limit for serum alcohol is 10 mg/dL. For medical purposes only. Performed at [facility], [address] [place], [place] [address]   cbc     Status: Abnormal   Collection Time: 04/14/18  4:04 PM  Result Value Ref Range   WBC 10.5 4.0 - 10.5 K/uL   RBC 5.88 (H) 4.22 - 5.81 MIL/uL   Hemoglobin 17.1 (H) 13.0 - 17.0 g/dL   HCT 61.6 07.3 - 71.0 %   MCV 81.5 80.0 - 100.0 fL   MCH 29.1 26.0 - 34.0 pg   MCHC 35.7 30.0 - 36.0 g/dL   RDW 62.6 94.8 - 54.6 %   Platelets 211 150 - 400 K/uL   nRBC 0.0 0.0 - 0.2 %    Comment: Performed at [facility], [address], [place], [place] [address]  No current facility-administered medications for this encounter.    Current Outpatient Medications  Medication Sig Dispense Refill  . metoprolol tartrate (LOPRESSOR) 25 MG tablet Take 0.5 tablets (12.5 mg total) by mouth 2 (two) times daily. 60 tablet 1  . OLANZapine (ZYPREXA) 15 MG tablet Take 1 tablet (15 mg total) by mouth at bedtime. 30 tablet 1     Musculoskeletal: Strength & Muscle Tone: within normal limits Gait & Station: normal Patient leans: N/A  Psychiatric Specialty Exam: Physical Exam  Nursing note and vitals reviewed. Constitutional: He is oriented to person, place, and time. He appears well-nourished. No distress.  HENT:  Head: Atraumatic.  Eyes: EOM are normal.  Neck: Normal range of motion.  Cardiovascular: Normal rate and regular rhythm.  Respiratory: Effort normal. No respiratory distress.  Musculoskeletal: Normal range of motion.  Neurological: He is alert and oriented to person, place, and time.  Skin: Skin is warm and dry.  Psychiatric: His affect is inappropriate. He is slowed. Thought content is paranoid. Cognition and memory are impaired. He expresses inappropriate judgment. He expresses no homicidal and no suicidal ideation. He is inattentive.    Review of Systems  Constitutional: Positive for chills.  HENT: Positive for tinnitus.        Copper taste in mouth  Eyes:       "Floaters", has had eyes checked multiple times for "seeing things" and told he has had metal in his eyes.   Respiratory: Negative.   Cardiovascular: Positive for palpitations.  Musculoskeletal: Negative.   Neurological: Positive for headaches.  Psychiatric/Behavioral: Positive for hallucinations. Negative for depression, substance abuse and suicidal ideas. The patient has insomnia. The patient is not nervous/anxious.     Blood pressure (!) 174/86, pulse 75, temperature 99 F (37.2 C), temperature source Oral, resp. rate 18, height  (1.676 m), weight 99.3 kg, SpO2 97 %.Body mass index is 35.35 kg/m.  General Appearance: Bizarre and Well Groomed  Eye Contact:  Stares blankly  Speech:  Clear and Coherent and Normal Rate  Volume:  Normal  Mood:  Euphoric  Affect:  Inappropriate  Thought Process:  Irrelevant and Descriptions of Associations: Tangential  Orientation:  Full (Time, Place, and Person)  Thought Content:   Illogical and Hallucinations: Auditory Visual  Suicidal Thoughts:  No  Homicidal Thoughts:  No  Memory:  Impaired  Judgement:  Impaired  Insight:  Lacking  Psychomotor Activity:  Normal  Concentration:  Attention Span: Poor  Recall:  Poor  Fund of Knowledge:  Poor  Language:  Fair  Akathisia:  No  Handed:  Right  AIMS (if indicated):     Assets:  Desire for Improvement Housing Intimacy Social Support Vocational/Educational  ADL's:  Intact  Cognition:  Impaired,  Mild  Sleep:   Significantly decreased in past 2 days     Treatment Plan Summary: Daily contact with patient to assess and evaluate symptoms and progress in treatment and Medication management  Defer further medication management to inpatient psychiatry team.  Per patient and wife request, will order a CT scan of head given family history and abrupt change in patient behavior. 1. No acute intracranial abnormalities 2.  Minimal fluid in inferior right mastoid air cells, stable no bony erosion or soft tissue thickening in this region.  Labs reviewed, and TSH, hemoglobin A1c, and lipid profile added to existing labs.  Disposition: Recommend psychiatric Inpatient admission when medically cleared.  [name], MD 04/14/2018 6:20 PM

## 2018-04-14 NOTE — ED Notes (Signed)
VOL/Pending Consult 

## 2018-04-14 NOTE — ED Notes (Signed)
BEHAVIORAL HEALTH ROUNDING Patient sleeping: No. Patient alert and oriented: yes Behavior appropriate: Yes.  ; If no, describe:  Nutrition and fluids offered: yes Toileting and hygiene offered: Yes  Sitter present: q15 minute observations and security  monitoring Law enforcement present: Yes  ODS  

## 2018-04-14 NOTE — BH Assessment (Signed)
Assessment Note  Tyler Watts is an 38 y.o. male who presents to the ER via his wife (Jennifer-[phone]) due to the patient having odd and bizarre behaviors. She states when the patient was discharged from [facility] June 2019 he was doing well until this past weekend. Wife states he's been saying odd things, sleep has decreased and blaming himself for things he has no control over. His son recently broke his arm and he believe he is the reason why. 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  He wife states, that is far from his normal behaviors. "He very tight with his money and he would have did the jobs. But today he said, I'm tired of being the police. I need to mind my own business."  During the interview, the patient was calm, cooperative and pleasant. Majority of the questions he didn't answer and advised to talk with his wife. Patient and wife denies history of aggression and violence. He also denies the use of mind-altering substances.  Diagnosis: Bipolar  Past Medical History:  Past Medical History:  Diagnosis Date  . Psoriasis of scalp     History reviewed. No pertinent surgical history.  Family History: History reviewed. No pertinent family history.  Social History:  reports that he quit smoking about 3 years ago. He has never used smokeless tobacco. He reports previous drug use. He reports that he does not drink alcohol.  Additional Social History:  Alcohol / Drug Use Pain Medications: See PTA Prescriptions: See PTA Over the Counter: See PTA History of alcohol / drug use?: No history of alcohol / drug abuse Longest period of sobriety (when/how long): Reports of none Negative Consequences of Use: (n/a) Withdrawal Symptoms: (n/a)  CIWA: CIWA-Ar BP: (!) 174/86 Pulse Rate: 75 COWS:    Allergies: No Known Allergies  Home Medications: (Not in a hospital admission)   OB/GYN Status:  No LMP for  male patient.  General Assessment Data Location of Assessment: [facility] ED TTS Assessment: In system Is this a Tele or Face-to-Face Assessment?: Face-to-Face Is this an Initial Assessment or a Re-assessment for this encounter?: Initial Assessment Patient Accompanied by:: N/A Language Other than English: No Living Arrangements: Other (Comment)(Private Home) What gender do you identify as?: Male Marital status: Married Pregnancy Status: No Living Arrangements: Spouse/significant other, Children Can pt return to current living arrangement?: Yes Admission Status: Voluntary Is patient capable of signing voluntary admission?: Yes Referral Source: Self/Family/Friend Insurance type: None  Medical Screening Exam [facility] Walk-in ONLY) Medical Exam completed: Yes  Crisis Care Plan Living Arrangements: Spouse/significant other, Children Legal Guardian: Other:(Self) Name of Psychiatrist: Reports of none Name of Therapist: Reports of none  Education Status Is patient currently in school?: No Is the patient employed, unemployed or receiving disability?: Employed  Risk to self with the past 6 months Suicidal Ideation: No Has patient been a risk to self within the past 6 months prior to admission? : No Suicidal Intent: No Has patient had any suicidal intent within the past 6 months prior to admission? : No Is patient at risk for suicide?: No Suicidal Plan?: No Has patient had any suicidal plan within the past 6 months prior to admission? : No Access to Means: No What has been your use of drugs/alcohol within the last 12 months?: Reports of none Previous Attempts/Gestures: No Other Self Harm Risks: Reports of none Triggers for Past Attempts: None known Intentional Self Injurious Behavior: None Family Suicide  History: Unknown Recent stressful life event(s): Other (Comment) Persecutory voices/beliefs?: No Depression: Yes Depression Symptoms: Isolating, Loss of interest in usual  pleasures Substance abuse history and/or treatment for substance abuse?: No Suicide prevention information given to non-admitted patients: Not applicable  Risk to Others within the past 6 months Homicidal Ideation: No Does patient have any lifetime risk of violence toward others beyond the six months prior to admission? : No Thoughts of Harm to Others: No Current Homicidal Intent: No Current Homicidal Plan: No Access to Homicidal Means: No Identified Victim: Reports of none History of harm to others?: No Assessment of Violence: None Noted Violent Behavior Description: Reports of none Does patient have access to weapons?: Yes (Comment) Criminal Charges Pending?: No Does patient have a court date: No Is patient on probation?: No  Psychosis Hallucinations: None noted Delusions: None noted  Mental Status Report Appearance/Hygiene: Unremarkable, In scrubs Eye Contact: Good Motor Activity: Freedom of movement, Unremarkable Speech: Unremarkable Level of Consciousness: Alert Mood: Anxious, Helpless, Sad, Pleasant Affect: Appropriate to circumstance, Anxious Anxiety Level: Moderate Thought Processes: Coherent, Relevant Judgement: Partial Orientation: Person, Place, Time, Situation, Appropriate for developmental age Obsessive Compulsive Thoughts/Behaviors: Minimal  Cognitive Functioning Concentration: Normal Memory: Recent Intact, Remote Intact Is patient IDD: No Insight: Fair Impulse Control: Fair Appetite: Good Have you had any weight changes? : No Change Sleep: Decreased Total Hours of Sleep: 4 Vegetative Symptoms: None  ADLScreening [facility] Assessment Services) Patient's cognitive ability adequate to safely complete daily activities?: Yes Patient able to express need for assistance with ADLs?: Yes Independently performs ADLs?: Yes (appropriate for developmental age)  Prior Inpatient Therapy Prior Inpatient Therapy: Yes Prior Therapy Dates: 07/2017 Prior Therapy  Facilty/Provider([facility]): [facility] Reason for Treatment: Bipolar  Prior Outpatient Therapy Prior Outpatient Therapy: Yes Prior Therapy Dates: 07/2017 Prior Therapy Facilty/Provider(s): [facility] Reason for Treatment: Bipolar Does patient have an ACCT team?: No Does patient have Intensive In-House Services?  : No Does patient have [facility] services? : No Does patient have P4CC services?: No  ADL Screening (condition at time of admission) Patient's cognitive ability adequate to safely complete daily activities?: Yes Is the patient deaf or have difficulty hearing?: No Does the patient have difficulty seeing, even when wearing glasses/contacts?: No Does the patient have difficulty concentrating, remembering, or making decisions?: No Patient able to express need for assistance with ADLs?: Yes Does the patient have difficulty dressing or bathing?: No Independently performs ADLs?: Yes (appropriate for developmental age) Does the patient have difficulty walking or climbing stairs?: No Weakness of Legs: None Weakness of Arms/Hands: None  Home Assistive Devices/Equipment Home Assistive Devices/Equipment: None  Therapy Consults (therapy consults require a physician order) PT Evaluation Needed: No OT Evalulation Needed: No SLP Evaluation Needed: No Abuse/Neglect Assessment (Assessment to be complete while patient is alone) Abuse/Neglect Assessment Can Be Completed: Yes Physical Abuse: Denies Verbal Abuse: Denies Sexual Abuse: Denies Exploitation of patient/patient's resources: Denies Self-Neglect: Denies Values / Beliefs Cultural Requests During Hospitalization: None Spiritual Requests During Hospitalization: None Consults Spiritual Care Consult Needed: No Social Work Consult Needed: No Merchant navy officer (For Healthcare) Does Patient Have a Medical Advance Directive?: No Would patient like information on creating a medical advance directive?: No - Patient declined       Child/Adolescent  Assessment Running Away Risk: Denies(Patient is an adult)  Disposition:  Disposition Initial Assessment Completed for this Encounter: Yes  On Site Evaluation by:   Reviewed with Physician:    Lilyan Gilford MS, LCAS, LPMHCA, NCC, CCSI Therapeutic Triage Specialist  04/14/2018 6:28 PM

## 2018-04-14 NOTE — ED Notes (Addendum)
Pt dressed out. Belongings include: boots, socks, jeans, belt, boxers, blue t-shirt, black hat, gray jacket. All pt's belongings including wallet and pocket knife given to pt's spouse.

## 2018-04-14 NOTE — Tx Team (Signed)
Initial Treatment Plan 04/14/2018 9:27 PM Tyler Watts MBP:[id number]    PATIENT STRESSORS: Financial difficulties Loss of grandmother    PATIENT STRENGTHS: Ability for insight Capable of independent living Motivation for treatment/growth Supportive family/friends   PATIENT IDENTIFIED PROBLEMS: "Anxiety"  "depression"  "Hopelessness thoughts"                 DISCHARGE CRITERIA:  Improved stabilization in mood, thinking, and/or behavior Motivation to continue treatment in a less acute level of care  PRELIMINARY DISCHARGE PLAN: Return to previous living arrangement  PATIENT/FAMILY INVOLVEMENT: This treatment plan has been presented to and reviewed with the patient, Tyler Watts.  The patient has been given the opportunity to ask questions and make suggestions.  Curly Rim, RN 04/14/2018, 9:27 PM

## 2018-04-14 NOTE — ED Notes (Signed)
Patient is transferred to [facility] via NT and officer. He is stable in NAD at the time of transfer. Patient belongings are with family. Report given to [facility]. No issues.

## 2018-04-14 NOTE — ED Notes (Signed)
Report to include Situation, Background, Assessment, and Recommendations received from Amy RN. Patient alert and oriented, warm and dry, in no acute distress. Patient denies SI, HI, AVH and pain. Patient made aware of Q15 minute rounds and Rover and Officer presence for their safety. Patient instructed to come to me with needs or concerns.   

## 2018-04-14 NOTE — ED Provider Notes (Signed)
[facility] Emergency Department Provider Note ____________________________________________   First MD Initiated Contact with Patient 04/14/18 1611     (approximate)  I have reviewed the triage vital signs and the nursing notes.   HISTORY  Chief Complaint Psychiatric Evaluation    HPI Tyler Watts is a 38 y.o. male with PMH as noted below who presents for psychiatric evaluation.  The patient states "I don't feel like myself."  When asked to elaborate, the patient states that he has not been sleeping.  He denies any drug or alcohol use.  He denies any SI or HI, but does state that hears voices which she describes as his "conscience."  He states that he did not continue medications after his admission in June.  He denies any acute medical symptoms.  Past Medical History:  Diagnosis Date  . Psoriasis of scalp     Patient Active Problem List   Diagnosis Date Noted  . Bipolar 1 disorder, manic, moderate (HCC) 04/14/2018  . Bipolar I disorder, current or most recent episode manic, with psychotic features (HCC) 07/16/2017    History reviewed. No pertinent surgical history.  Prior to Admission medications   Medication Sig Start Date End Date Taking? Authorizing Provider  metoprolol tartrate (LOPRESSOR) 25 MG tablet Take 0.5 tablets (12.5 mg total) by mouth 2 (two) times daily. 07/22/17   Pucilowska, Jolanta B, MD  OLANZapine (ZYPREXA) 15 MG tablet Take 1 tablet (15 mg total) by mouth at bedtime. 07/22/17   Pucilowska, Ellin Goodie, MD    Allergies Patient has no known allergies.  History reviewed. No pertinent family history.  Social History Social History   Tobacco Use  . Smoking status: Former Smoker    Last attempt to quit: 2017    Years since quitting: 3.1  . Smokeless tobacco: Never Used  Substance Use Topics  . Alcohol use: No    Comment: 2 beers in a year  . Drug use: Not Currently    Review of Systems  Constitutional: No fever. Eyes:  No redness. ENT: No sore throat. Cardiovascular: Denies chest pain. Respiratory: Denies shortness of breath. Gastrointestinal: No vomiting or diarrhea.  Genitourinary: Negative for flank pain.  Musculoskeletal: Negative for back pain. Skin: Negative for rash. Neurological: Negative for headache.   ____________________________________________   PHYSICAL EXAM:  VITAL SIGNS: ED Triage Vitals [04/14/18 1601]  Enc Vitals Group     BP (!) 174/86     Pulse Rate 75     Resp 18     Temp 99 F (37.2 C)     Temp Source Oral     SpO2 97 %     Weight 219 lb (99.3 kg)     Height 5\' 6"  (1.676 m)     Head Circumference      Peak Flow      Pain Score 0     Pain Loc      Pain Edu?      Excl. in GC?     Constitutional: Alert and oriented.  Relatively well appearing and in no acute distress. Eyes: Conjunctivae are normal.  Head: Atraumatic. Nose: No congestion/rhinnorhea. Mouth/Throat: Mucous membranes are moist.   Neck: Normal range of motion.  Cardiovascular: Good peripheral circulation. Respiratory: Normal respiratory effort.  Gastrointestinal: No distention.  Genitourinary: No CVA tenderness. Musculoskeletal: Extremities warm and well perfused.  Neurologic:  Normal speech and language. No gross focal neurologic deficits are appreciated.  Skin:  Skin is warm and dry. No rash noted.  Psychiatric: Somewhat flat affect.  Calm and cooperative.  ____________________________________________   LABS (all labs ordered are listed, but only abnormal results are displayed)  Labs Reviewed  COMPREHENSIVE METABOLIC PANEL - Abnormal; Notable for the following components:      Result Value   Glucose, Bld 130 (*)    All other components within normal limits  CBC - Abnormal; Notable for the following components:   RBC 5.88 (*)    Hemoglobin 17.1 (*)    All other components within normal limits  LIPID PANEL - Abnormal; Notable for the following components:   Cholesterol 261 (*)    LDL  Cholesterol 191 (*)    All other components within normal limits  ETHANOL  TSH  HEMOGLOBIN A1C   ____________________________________________  EKG   ____________________________________________  RADIOLOGY    ____________________________________________   PROCEDURES  Procedure(s) performed: No  Procedures  Critical Care performed: No ____________________________________________   INITIAL IMPRESSION / ASSESSMENT AND PLAN / ED COURSE  Pertinent labs & imaging results that were available during my care of the patient were reviewed by me and considered in my medical decision making (see chart for details).  38 year old male with history of bipolar disorder presents stating "I don't feel like myself" and describes inability to sleep and hearing voices.  He has no SI or HI.  Per his wife who came in with him, he has symptoms similar to a recent psychiatric admission last June.  I reviewed past medical records in Epic.  The patient was admitted in June 2019 for mixed bipolar episode and was discharged with Zyprexa.  On exam, the patient is calm and cooperative but has a flat affect.  His vital signs are normal except for hypertension.  The remainder of the exam is unremarkable.  Presentation is concerning for a bipolar episode with possible psychosis.  At this time, the patient is here voluntarily, does not demonstrate danger to self or others acutely, and is able to contract for safety so I will not place him under commitment at this time.  I consulted Dr. Viviano Simas from psychiatry to come evaluate the patient.  ----------------------------------------- 12:14 AM on 04/15/2018 -----------------------------------------  Dr. Viviano Simas recommended inpatient psychiatry admission. ____________________________________________   FINAL CLINICAL IMPRESSION(S) / ED DIAGNOSES  Final diagnoses:  Psychosis, unspecified psychosis type (HCC)      NEW MEDICATIONS STARTED DURING THIS  VISIT:  Discharge Medication List as of 04/14/2018  8:13 PM       Note:  This document was prepared using Dragon voice recognition software and may include unintentional dictation errors.    Dionne Bucy, MD 04/15/18 [phone]

## 2018-04-15 DIAGNOSIS — F312 Bipolar disorder, current episode manic severe with psychotic features: Principal | ICD-10-CM

## 2018-04-15 LAB — URINE DRUG SCREEN, QUALITATIVE (ARMC ONLY)
Amphetamines, Ur Screen: NOT DETECTED
Barbiturates, Ur Screen: NOT DETECTED
Benzodiazepine, Ur Scrn: NOT DETECTED
CANNABINOID 50 NG, UR ~~LOC~~: NOT DETECTED
Cocaine Metabolite,Ur ~~LOC~~: NOT DETECTED
MDMA (Ecstasy)Ur Screen: NOT DETECTED
Methadone Scn, Ur: NOT DETECTED
Opiate, Ur Screen: NOT DETECTED
PHENCYCLIDINE (PCP) UR S: NOT DETECTED
Tricyclic, Ur Screen: NOT DETECTED

## 2018-04-15 MED ORDER — OLANZAPINE 5 MG PO TBDP
15.0000 mg | ORAL_TABLET | Freq: Every day | ORAL | Status: DC
  Administered 2018-04-15 – 2018-04-16 (×2): 15 mg via ORAL
  Filled 2018-04-15 (×2): qty 3

## 2018-04-15 NOTE — BHH Group Notes (Signed)
Feelings Around Diagnosis 04/15/2018 1PM  Type of Therapy/Topic:  Group Therapy:  Feelings about Diagnosis  Participation Level:  Did Not Attend   Description of Group:   This group will allow patients to explore their thoughts and feelings about diagnoses they have received. Patients will be guided to explore their level of understanding and acceptance of these diagnoses. Facilitator will encourage patients to process their thoughts and feelings about the reactions of others to their diagnosis and will guide patients in identifying ways to discuss their diagnosis with significant others in their lives. This group will be process-oriented, with patients participating in exploration of their own experiences, giving and receiving support, and processing challenge from other group members.   Therapeutic Goals: 1. Patient will demonstrate understanding of diagnosis as evidenced by identifying two or more symptoms of the disorder 2. Patient will be able to express two feelings regarding the diagnosis 3. Patient will demonstrate their ability to communicate their needs through discussion and/or role play  Summary of Patient Progress:       Therapeutic Modalities:   Cognitive Behavioral Therapy Brief Therapy Feelings Identification    Sabiha Sura T Jerick Khachatryan, LCSW 04/15/2018 4:32 PM  

## 2018-04-15 NOTE — Progress Notes (Signed)
Recreation Therapy Notes   Date: 04/15/2018  Time: 9:30 am  Location: Craft Room  Behavioral response: Appropriate, Tearful, Redirected  Intervention Topic: Happiness  Discussion/Intervention:  Group content today was focused on Happiness. The group defined happiness and described where happiness comes from. Individuals identified what makes them happy and how they go about making others happy. Patients expressed things that stop them from being happy and ways they can improve their happiness. The group stated reasons why it is important to be happy. The group participated in the intervention "My Happiness", where they had a chance to identify and express things that make them happy. Clinical Observations/Feedback:  Patient came to group and explained that sometimes he gets in his own way of happiness. He explained it is more important to be happy than it is to be sad. Participant participated in the intervention and was social with staff.  Individual was called to the nurse station on his way out he grabbed Psychologist, sport and exercise. Writer explain to patient that a handshake is more appropriate for the hospital setting. Patient smile and nodded as if he understood what the writer was saying and left group but never returned.   Whitley Strycharz LRT/CTRS         Bayleigh Loflin 04/15/2018 10:39 AM

## 2018-04-15 NOTE — Plan of Care (Signed)
  Problem: Activity: Goal: Interest or engagement in leisure activities will improve Outcome: Not Progressing   Problem: Coping: Goal: Will verbalize feelings Outcome: Not Progressing   Problem: Role Relationship: Goal: Will demonstrate positive changes in social behaviors and relationships Outcome: Not Progressing
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
## 2018-04-15 NOTE — Progress Notes (Signed)
Admission Note:   D:38 yr old  male who presents voluntary in no acute distress for the treatment of bizarre and odd behaviors. Patient has a history of Bipolar. Patient is calm and cooperative with the admission process. Patient denies SI/HI and A/V hallucinations.    A: Skin was assessed and found to be clear of any abnormal marks apart from noted psoriasis per pt. PT searched and no contraband found, POC and unit policies explained and understanding verbalized. Consents obtained. Food and fluids offered, and fluids accepted. Pt had no additional questions or concerns.  R:Patient with Q 15 minute checks in progress and patient remains safe on unit. Monitoring continues.

## 2018-04-15 NOTE — H&P (Signed)
Psychiatric Admission Assessment Adult  Patient Identification: Tyler Watts MRN:  [id number] Date of Evaluation:  04/15/2018 Chief Complaint:  Bipolar 1 Principal Diagnosis: Bipolar I disorder, current or most recent episode manic, with psychotic features (HCC) Diagnosis:  Principal Problem:   Bipolar I disorder, current or most recent episode manic, with psychotic features (HCC)  History of Present Illness: Patient seen chart reviewed.  This is a patient with a past history of a psychotic episode about 8 months ago who presents to the hospital because of concern from his wife.  As the patient says "my wife brought me here because I am crazy".  He cannot give any clear explanation of that.  When asked questions about paranoia and hallucinations he gives vague answers.  Has an inappropriate smile throughout the interview.  Denies suicidal or homicidal ideation.  Patient admits that he did not follow-up with any psychiatric treatment after his hospitalization in June 2019.  There was no clear recent stressor.  Patient denies suicidal thoughts.  Denies any recent new changes in his life routine.  Denies alcohol or drug abuse. Associated Signs/Symptoms: Depression Symptoms:  psychomotor retardation, (Hypo) Manic Symptoms:  Distractibility, Impulsivity, Labiality of Mood, Anxiety Symptoms:  None clear Psychotic Symptoms:  Hallucinations: Auditory Paranoia, PTSD Symptoms: Negative Total Time spent with patient: 1 hour  Past Psychiatric History: Patient had an episode that presented similar to this in June 2019.  Was diagnosed with bipolar disorder with mixed episode psychotic and treated with Zyprexa with good resolution.  Did not continue on medicine after discharge.  Had not had episodes prior to that.  No evidence of recent substance abuse.  No prior suicide attempts.  No history of violence.  Is the patient at risk to self? Yes.    Has the patient been a risk to self in the past 6 months?  No.  Has the patient been a risk to self within the distant past? Yes.    Is the patient a risk to others? No.  Has the patient been a risk to others in the past 6 months? No.  Has the patient been a risk to others within the distant past? No.   Prior Inpatient Therapy:   Prior Outpatient Therapy:    Alcohol Screening: 1. How often do you have a drink containing alcohol?: Never 2. How many drinks containing alcohol do you have on a typical day when you are drinking?: 1 or 2 3. How often do you have six or more drinks on one occasion?: Never AUDIT-C Score: 0 4. How often during the last year have you found that you were not able to stop drinking once you had started?: Never 5. How often during the last year have you failed to do what was normally expected from you becasue of drinking?: Never 6. How often during the last year have you needed a first drink in the morning to get yourself going after a heavy drinking session?: Never 7. How often during the last year have you had a feeling of guilt of remorse after drinking?: Never 8. How often during the last year have you been unable to remember what happened the night before because you had been drinking?: Never 9. Have you or someone else been injured as a result of your drinking?: No 10. Has a relative or friend or a doctor or another health worker been concerned about your drinking or suggested you cut down?: No Alcohol Use Disorder Identification Test Final Score (AUDIT): 0 Alcohol Brief  Interventions/Follow-up: AUDIT Score <7 follow-up not indicated Substance Abuse History in the last 12 months:  No. Consequences of Substance Abuse: Negative Previous Psychotropic Medications: Yes  Psychological Evaluations: Yes  Past Medical History:  Past Medical History:  Diagnosis Date  . Psoriasis of scalp    History reviewed. No pertinent surgical history. Family History: History reviewed. No pertinent family history. Family Psychiatric   History: None known Tobacco Screening: Have you used any form of tobacco in the last 30 days? (Cigarettes, Smokeless Tobacco, Cigars, and/or Pipes): No Social History:  Social History   Substance and Sexual Activity  Alcohol Use No   Comment: 2 beers in a year     Social History   Substance and Sexual Activity  Drug Use Not Currently    Additional Social History:                           Allergies:  No Known Allergies Lab Results:  Results for orders placed or performed during the hospital encounter of 04/14/18 (from the past 48 hour(s))  Urine Drug Screen, Qualitative     Status: None   Collection Time: 04/15/18  1:02 AM  Result Value Ref Range   Tricyclic, Ur Screen NONE DETECTED NONE DETECTED   Amphetamines, Ur Screen NONE DETECTED NONE DETECTED   MDMA (Ecstasy)Ur Screen NONE DETECTED NONE DETECTED   Cocaine Metabolite,Ur [place] NONE DETECTED NONE DETECTED   Opiate, Ur Screen NONE DETECTED NONE DETECTED   Phencyclidine (PCP) Ur S NONE DETECTED NONE DETECTED   Cannabinoid 50 Ng, Ur [place] NONE DETECTED NONE DETECTED   Barbiturates, Ur Screen NONE DETECTED NONE DETECTED   Benzodiazepine, Ur Scrn NONE DETECTED NONE DETECTED   Methadone Scn, Ur NONE DETECTED NONE DETECTED    Comment: (NOTE) Tricyclics + metabolites, urine    Cutoff 1000 ng/mL Amphetamines + metabolites, urine  Cutoff 1000 ng/mL MDMA (Ecstasy), urine              Cutoff 500 ng/mL Cocaine Metabolite, urine          Cutoff 300 ng/mL Opiate + metabolites, urine        Cutoff 300 ng/mL Phencyclidine (PCP), urine         Cutoff 25 ng/mL Cannabinoid, urine                 Cutoff 50 ng/mL Barbiturates + metabolites, urine  Cutoff 200 ng/mL Benzodiazepine, urine              Cutoff 200 ng/mL Methadone, urine                   Cutoff 300 ng/mL The urine drug screen provides only a preliminary, unconfirmed analytical test result and should not be used for non-medical purposes. Clinical consideration and  professional judgment should be applied to any positive drug screen result due to possible interfering substances. A more specific alternate chemical method must be used in order to obtain a confirmed analytical result. Gas chromatography / mass spectrometry (GC/MS) is the preferred confirmat ory method. Performed at [facility], [address] [place], [place] [address]     Blood Alcohol level:  Lab Results  Component Value Date   [facility]   ETH <10 07/15/2017    Metabolic Disorder Labs:  Lab Results  Component Value Date   HGBA1C 4.8 04/14/2018   MPG 91.06 04/14/2018   MPG 93.93 07/17/2017   No results found for: PROLACTIN  Lab Results  Component Value Date   CHOL 261 (H) 04/14/2018   TRIG 138 04/14/2018   HDL 42 04/14/2018   CHOLHDL 6.2 04/14/2018   VLDL 28 04/14/2018   LDLCALC 191 (H) 04/14/2018   LDLCALC 109 (H) 07/17/2017    Current Medications: Current Facility-Administered Medications  Medication Dose Route Frequency Provider Last Rate Last Dose  . acetaminophen (TYLENOL) tablet 650 mg  650 mg Oral Q6H PRN [name], MD      . alum & mag hydroxide-simeth (MAALOX/MYLANTA) 200-200-20 MG/5ML suspension 30 mL  30 mL Oral Q4H PRN [name], MD      . hydrOXYzine (ATARAX/VISTARIL) tablet 25 mg  25 mg Oral TID PRN [name], MD      . OLANZapine zydis (ZYPREXA) disintegrating tablet 10 mg  10 mg Oral Q8H PRN [name], MD       And  . LORazepam (ATIVAN) tablet 1 mg  1 mg Oral PRN [name], MD       And  . ziprasidone (GEODON) injection 20 mg  20 mg Intramuscular PRN [name], MD      . magnesium hydroxide (MILK OF MAGNESIA) suspension 30 mL  30 mL Oral Daily PRN [name], MD      . OLANZapine zydis (ZYPREXA) disintegrating tablet 15 mg  15 mg Oral QHS [name], Jackquline Denmark, MD       PTA Medications: Medications Prior to Admission  Medication Sig Dispense Refill Last Dose  . metoprolol tartrate  (LOPRESSOR) 25 MG tablet Take 0.5 tablets (12.5 mg total) by mouth 2 (two) times daily. 60 tablet 1   . OLANZapine (ZYPREXA) 15 MG tablet Take 1 tablet (15 mg total) by mouth at bedtime. 30 tablet 1     Musculoskeletal: Strength & Muscle Tone: within normal limits Gait & Station: normal Patient leans: N/A  Psychiatric Specialty Exam: Physical Exam  Vitals reviewed. Constitutional: He appears well-developed and well-nourished.  HENT:  Head: Normocephalic and atraumatic.  Eyes: Pupils are equal, round, and reactive to light. Conjunctivae are normal.  Neck: Normal range of motion.  Cardiovascular: Regular rhythm and normal heart sounds.  Respiratory: Effort normal.  GI: Soft.  Musculoskeletal: Normal range of motion.  Neurological: He is alert.  Skin: Skin is warm and dry.  Psychiatric: His affect is blunt. His speech is delayed. He is slowed. Cognition and memory are impaired. He expresses inappropriate judgment. He expresses no homicidal and no suicidal ideation. He is noncommunicative.    Review of Systems  Constitutional: Negative.   HENT: Negative.   Eyes: Negative.   Respiratory: Negative.   Cardiovascular: Negative.   Gastrointestinal: Negative.   Musculoskeletal: Negative.   Skin: Negative.   Neurological: Negative.   Psychiatric/Behavioral: Positive for hallucinations. Negative for depression, substance abuse and suicidal ideas. The patient is nervous/anxious and has insomnia.     Blood pressure (!) 146/88, pulse 67, temperature 98.4 F (36.9 C), temperature source Oral, resp. rate 18, height 5' 5.75" (1.67 m), weight 96.6 kg, SpO2 99 %.Body mass index is 34.64 kg/m.  General Appearance: Fairly Groomed  Eye Contact:  Good  Speech:  Slow  Volume:  Decreased  Mood:  Euthymic  Affect:  Constricted and Inappropriate  Thought Process:  Disorganized  Orientation:  Negative  Thought Content:  Illogical, Paranoid Ideation and Rumination  Suicidal Thoughts:  No   Homicidal Thoughts:  No  Memory:  Immediate;   Fair Recent;   Fair Remote;  Fair  Judgement:  Impaired  Insight:  Shallow  Psychomotor Activity:  Decreased  Concentration:  Concentration: Fair  Recall:  [employer] of Knowledge:  Fair  Language:  Fair  Akathisia:  No  Handed:  Right  AIMS (if indicated):     Assets:  Housing Physical Health Resilience Social Support  ADL's:  Intact  Cognition:  Impaired,  Mild  Sleep:  Number of Hours: 2.3    Treatment Plan Summary: Daily contact with patient to assess and evaluate symptoms and progress in treatment, Medication management and Plan Patient appears to be having another psychotic episode with mixed mood symptoms.  Given prior good response we will restart Zyprexa 15 mg at night.  Patient has no complaint.  Engage in individual and group therapy.  Try to get him sleeping better at night.  Work in tandem with the family to make sure we have appropriate plans in place when he is discharged.  Observation Level/Precautions:  15 minute checks  Laboratory:  Chemistry Profile  Psychotherapy:    Medications:    Consultations:    Discharge Concerns:    Estimated LOS:  Other:     Physician Treatment Plan for Primary Diagnosis: Bipolar I disorder, current or most recent episode manic, with psychotic features (HCC) Long Term Goal(s): Improvement in symptoms so as ready for discharge  Short Term Goals: Ability to demonstrate self-control will improve and Ability to identify and develop effective coping behaviors will improve  Physician Treatment Plan for Secondary Diagnosis: Principal Problem:   Bipolar I disorder, current or most recent episode manic, with psychotic features (HCC)  Long Term Goal(s): Improvement in symptoms so as ready for discharge  Short Term Goals: Compliance with prescribed medications will improve  I certify that inpatient services furnished can reasonably be expected to improve the patient's condition.     Mordecai Rasmussen, MD 3/10/20206:20 PM

## 2018-04-15 NOTE — Progress Notes (Addendum)
D: Patient does not appear to be in physical distress.  He did not receive any medication this am.  He appears pleasant with staff.  He is observed pacing the unit.  He rates his depression, hopelessness, and anxiety as a 2.  He denies any thoughts of self harm. He reports poor sleep, poor appetite and concentration. His energy level is low.  Patient continues to bizarre behavior.  It was report he attempted to hug the rec. Therapist this am. Patient has also been going into another patient's room.  He was directed back to his room several times.  The last time that he was directed back to his room, he was informed that if he did not cooperate with staff, he would have to be put on 1:1 observation.    A: Continue to monitor medication management and MD orders.  Safety checks completed every 15 minutes per protocol.  Offer support and encouragement as needed.  R: Patient is guarded; cautious; he is redirectable.

## 2018-04-15 NOTE — [facility] Suicide Risk Assessment (Signed)
[facility] Admission Suicide Risk Assessment   Nursing information obtained from:  Patient Demographic factors:  Male, Caucasian Current Mental Status:  NA Loss Factors:  Loss of significant relationship, Financial problems / change in socioeconomic status Historical Factors:  NA Risk Reduction Factors:  Living with another person, especially a relative  Total Time spent with patient: 1 hour Principal Problem: Bipolar I disorder, current or most recent episode manic, with psychotic features (HCC) Diagnosis:  Principal Problem:   Bipolar I disorder, current or most recent episode manic, with psychotic features (HCC)  Subjective Data: Patient seen.  Chart reviewed.  This patient was brought to the emergency room by his wife because of a couple days of the patient becoming more disorganized and appearing to be psychotic at home.  Patient tells me "my wife brought me in because I am crazy".  He says this with a smile on his face but then cannot articulate any example of what he means by that.  Most of the rest of his replies to my questions are non sequiturs.  Denies any current suicidal ideation.  When asked about hearing voices he gives very vague and ambivalent answers.  Does not deny that he will take medication.  Denies any recent alcohol or drug abuse.  According to the chart there was no clear-cut recent stressor.  Patient had been functioning reasonably well since last June when he had a previous psychotic episode.  This decompensation happened within the last week or 2.  Continued Clinical Symptoms:  Alcohol Use Disorder Identification Test Final Score (AUDIT): 0 The "Alcohol Use Disorders Identification Test", Guidelines for Use in Primary Care, Second Edition.  World Science writer [facility]). Score between 0-7:  no or low risk or alcohol related problems. Score between 8-15:  moderate risk of alcohol related problems. Score between 16-19:  high risk of alcohol related problems. Score 20 or  above:  warrants further diagnostic evaluation for alcohol dependence and treatment.   CLINICAL FACTORS:   Bipolar Disorder:   Mixed State   Musculoskeletal: Strength & Muscle Tone: within normal limits Gait & Station: normal Patient leans: N/A  Psychiatric Specialty Exam: Physical Exam  Nursing note and vitals reviewed. Constitutional: He appears well-developed and well-nourished.  HENT:  Head: Normocephalic and atraumatic.  Eyes: Pupils are equal, round, and reactive to light. Conjunctivae are normal.  Neck: Normal range of motion.  Cardiovascular: Regular rhythm and normal heart sounds.  Respiratory: Effort normal. No respiratory distress.  GI: Soft.  Musculoskeletal: Normal range of motion.  Neurological: He is alert.  Skin: Skin is warm and dry.  Psychiatric: His affect is labile and inappropriate. His speech is tangential. He is not agitated. Cognition and memory are impaired. He expresses impulsivity and inappropriate judgment. He expresses no homicidal and no suicidal ideation. He is noncommunicative. He is inattentive.    Review of Systems  Constitutional: Negative.   HENT: Negative.   Eyes: Negative.   Respiratory: Negative.   Cardiovascular: Negative.   Gastrointestinal: Negative.   Musculoskeletal: Negative.   Skin: Negative.   Neurological: Negative.   Psychiatric/Behavioral: Positive for memory loss. The patient is nervous/anxious.     Blood pressure (!) 146/88, pulse 67, temperature 98.4 F (36.9 C), temperature source Oral, resp. rate 18, height 5' 5.75" (1.67 m), weight 96.6 kg, SpO2 99 %.Body mass index is 34.64 kg/m.  General Appearance: Fairly Groomed  Eye Contact:  Good  Speech:  Slow  Volume:  Decreased  Mood:  Euthymic  Affect:  Inappropriate  Thought Process:  Disorganized  Orientation:  Full (Time, Place, and Person)  Thought Content:  Illogical  Suicidal Thoughts:  No  Homicidal Thoughts:  No  Memory:  Immediate;   Fair Recent;    Fair Remote;   Fair  Judgement:  Impaired  Insight:  Shallow  Psychomotor Activity:  Decreased  Concentration:  Concentration: Poor  Recall:  Poor  Fund of Knowledge:  Fair  Language:  Fair  Akathisia:  No  Handed:  Right  AIMS (if indicated):     Assets:  Desire for Improvement Housing Physical Health Social Support  ADL's:  Impaired  Cognition:  Impaired,  Mild  Sleep:  Number of Hours: 2.3      COGNITIVE FEATURES THAT CONTRIBUTE TO RISK:  Closed-mindedness and Loss of executive function    SUICIDE RISK:   Minimal: No identifiable suicidal ideation.  Patients presenting with no risk factors but with morbid ruminations; may be classified as minimal risk based on the severity of the depressive symptoms  PLAN OF CARE: Patient admitted to the hospital due to psychosis.  15-minute checks will continue.  Restart an antipsychotic as per his previous hospitalization.  Patient will be engaged in full treatment team assessment and individual and group therapy and we will work with the family on appropriate discharge planning.  I certify that inpatient services furnished can reasonably be expected to improve the patient's condition.   Mordecai Rasmussen, MD 04/15/2018, 6:16 PM

## 2018-04-15 NOTE — [facility] Suicide Risk Assessment (Signed)
[facility] INPATIENT:  Family/Significant Other Suicide Prevention Education  Suicide Prevention Education:  Education Completed; Delroy Hunton, wife [phone] has been identified by the patient as the family member/significant other with whom the patient will be residing, and identified as the person(s) who will aid the patient in the event of a mental health crisis (suicidal ideations/suicide attempt).  With written consent from the patient, the family member/significant other has been provided the following suicide prevention education, prior to the and/or following the discharge of the patient.  The suicide prevention education provided includes the following:  Suicide risk factors  Suicide prevention and interventions  National Suicide Hotline telephone number  [facility] assessment telephone number  [facility] Emergency Assistance 911  [facility] and/or Residential Mobile Crisis Unit telephone number  Request made of family/significant other to:  Remove weapons (e.g., guns, rifles, knives), all items previously/currently identified as safety concern.    Remove drugs/medications (over-the-counter, prescriptions, illicit drugs), all items previously/currently identified as a safety concern.  The family member/significant other verbalizes understanding of the suicide prevention education information provided.  The family member/significant other agrees to remove the items of safety concern listed above.  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 Pt did not sleep at all Sunday night thinking about not getting paid for the job he did. [name] reports concerns of SI due to how pt was talking about himself and that he was being/talking weird. [name] reports that there are guns at the home, but she secured them in a gun safe and has the key with her.  [name] denies no concerns pt returning home as long as he follows up with treatment. [name] reports she believes he does not follow up due to thinking it is too much money, but that they can afford it. [name] reported that she will be picking up pt at discharge. [name] inquired about bringing pt clothes and reported she would bring him two outfits and some pajamas.   Charlann Lange Ravis Herne MSW LCSW 04/15/2018, 11:03 AM

## 2018-04-16 NOTE — Progress Notes (Signed)
[facility] MD Progress Note  04/16/2018 4:33 PM Tyler Watts  MRN:  [id number] Subjective: Follow-up for this patient with bipolar disorder or possibly reactive psychosis.  Patient seen chart reviewed.  At morning report this morning I was told that the patient had been behaving in a hypersexual way with some of the nursing staff.  He was found curled up in a fetal position in a shower this morning apparently waiting on some kind of help.  On interview this afternoon the patient is awake and alert but still interacting in an inappropriate manner.  Odd affect.  Minimal speech.  Bizarre smile.  Tolerating medication well. Principal Problem: Bipolar I disorder, current or most recent episode manic, with psychotic features (HCC) Diagnosis: Principal Problem:   Bipolar I disorder, current or most recent episode manic, with psychotic features (HCC)  Total Time spent with patient: 30 minutes  Past Psychiatric History: Patient has a history of a prior episode several months ago with no interval follow-up  Past Medical History:  Past Medical History:  Diagnosis Date  . Psoriasis of scalp    History reviewed. No pertinent surgical history. Family History: History reviewed. No pertinent family history. Family Psychiatric  History: None Social History:  Social History   Substance and Sexual Activity  Alcohol Use No   Comment: 2 beers in a year     Social History   Substance and Sexual Activity  Drug Use Not Currently    Social History   Socioeconomic History  . Marital status: Married    Spouse name: Not on file  . Number of children: Not on file  . Years of education: Not on file  . Highest education level: Not on file  Occupational History  . Not on file  Social Needs  . Financial resource strain: Not on file  . Food insecurity:    Worry: Not on file    Inability: Not on file  . Transportation needs:    Medical: Not on file    Non-medical: Not on file  Tobacco Use  . Smoking  status: Former Smoker    Last attempt to quit: 2017    Years since quitting: 3.1  . Smokeless tobacco: Never Used  Substance and Sexual Activity  . Alcohol use: No    Comment: 2 beers in a year  . Drug use: Not Currently  . Sexual activity: Yes  Lifestyle  . Physical activity:    Days per week: Not on file    Minutes per session: Not on file  . Stress: Not on file  Relationships  . Social connections:    Talks on phone: Not on file    Gets together: Not on file    Attends religious service: Not on file    Active member of club or organization: Not on file    Attends meetings of clubs or organizations: Not on file    Relationship status: Not on file  Other Topics Concern  . Not on file  Social History Narrative  . Not on file   Additional Social History:                         Sleep: Fair  Appetite:  Fair  Current Medications: Current Facility-Administered Medications  Medication Dose Route Frequency Provider Last Rate Last Dose  . acetaminophen (TYLENOL) tablet 650 mg  650 mg Oral Q6H PRN [name], MD      . alum & mag hydroxide-simeth (  MAALOX/MYLANTA) 200-200-20 MG/5ML suspension 30 mL  30 mL Oral Q4H PRN Mariel CraftMaurer, Sheila M, MD      . hydrOXYzine (ATARAX/VISTARIL) tablet 25 mg  25 mg Oral TID PRN Mariel CraftMaurer, Sheila M, MD      . OLANZapine zydis [facility](ZYPREXA) disintegrating tablet 10 mg  10 mg Oral Q8H PRN Mariel CraftMaurer, Sheila M, MD       And  . LORazepam (ATIVAN) tablet 1 mg  1 mg Oral PRN Mariel CraftMaurer, Sheila M, MD       And  . ziprasidone (GEODON) injection 20 mg  20 mg Intramuscular PRN Mariel CraftMaurer, Sheila M, MD      . magnesium hydroxide (MILK OF MAGNESIA) suspension 30 mL  30 mL Oral Daily PRN Mariel CraftMaurer, Sheila M, MD      . OLANZapine zydis [facility](ZYPREXA) disintegrating tablet 15 mg  15 mg Oral QHS Brinton Brandel, Jackquline DenmarkJohn T, MD   15 mg at 04/15/18 2200    Lab Results:  Results for orders placed or performed during the hospital encounter of 04/14/18 (from the past 48 hour(s))  Urine  Drug Screen, Qualitative     Status: None   Collection Time: 04/15/18  1:02 AM  Result Value Ref Range   Tricyclic, Ur Screen NONE DETECTED NONE DETECTED   Amphetamines, Ur Screen NONE DETECTED NONE DETECTED   MDMA (Ecstasy)Ur Screen NONE DETECTED NONE DETECTED   Cocaine Metabolite,Ur [place] NONE DETECTED NONE DETECTED   Opiate, Ur Screen NONE DETECTED NONE DETECTED   Phencyclidine (PCP) Ur S NONE DETECTED NONE DETECTED   Cannabinoid 50 Ng, Ur [place] NONE DETECTED NONE DETECTED   Barbiturates, Ur Screen NONE DETECTED NONE DETECTED   Benzodiazepine, Ur Scrn NONE DETECTED NONE DETECTED   Methadone Scn, Ur NONE DETECTED NONE DETECTED    Comment: (NOTE) Tricyclics + metabolites, urine    Cutoff 1000 ng/mL Amphetamines + metabolites, urine  Cutoff 1000 ng/mL MDMA (Ecstasy), urine              Cutoff 500 ng/mL Cocaine Metabolite, urine          Cutoff 300 ng/mL Opiate + metabolites, urine        Cutoff 300 ng/mL Phencyclidine (PCP), urine         Cutoff 25 ng/mL Cannabinoid, urine                 Cutoff 50 ng/mL Barbiturates + metabolites, urine  Cutoff 200 ng/mL Benzodiazepine, urine              Cutoff 200 ng/mL Methadone, urine                   Cutoff 300 ng/mL The urine drug screen provides only a preliminary, unconfirmed analytical test result and should not be used for non-medical purposes. Clinical consideration and professional judgment should be applied to any positive drug screen result due to possible interfering substances. A more specific alternate chemical method must be used in order to obtain a confirmed analytical result. Gas chromatography / mass spectrometry (GC/MS) is the preferred confirmat ory method. Performed at [facility], [address] [place], [place] [address]     Blood Alcohol level:  Lab Results  Component Value Date   [facility]   ETH <10 07/15/2017    Metabolic Disorder Labs: Lab Results  Component Value Date   HGBA1C 4.8  04/14/2018   MPG 91.06 04/14/2018   MPG 93.93 07/17/2017   No results found for: PROLACTIN Lab Results  Component Value Date  CHOL 261 (H) 04/14/2018   TRIG 138 04/14/2018   HDL 42 04/14/2018   CHOLHDL 6.2 04/14/2018   VLDL 28 04/14/2018   LDLCALC 191 (H) 04/14/2018   LDLCALC 109 (H) 07/17/2017    Physical Findings: AIMS:  , ,  ,  ,    CIWA:    COWS:     Musculoskeletal: Strength & Muscle Tone: within normal limits Gait & Station: normal Patient leans: N/A  Psychiatric Specialty Exam: Physical Exam  Constitutional: He appears well-developed and well-nourished.  HENT:  Head: Normocephalic and atraumatic.  Eyes: Pupils are equal, round, and reactive to light. Conjunctivae are normal.  Neck: Normal range of motion.  Cardiovascular: Normal heart sounds.  Respiratory: Effort normal.  GI: Soft.  Musculoskeletal: Normal range of motion.  Neurological: He is alert.  Skin: Skin is warm and dry.  Psychiatric: His affect is blunt and inappropriate. His speech is delayed. He is withdrawn. Cognition and memory are impaired. He expresses inappropriate judgment. He expresses no homicidal and no suicidal ideation.    Review of Systems  Constitutional: Negative.   HENT: Negative.   Eyes: Negative.   Respiratory: Negative.   Cardiovascular: Negative.   Gastrointestinal: Negative.   Musculoskeletal: Negative.   Skin: Negative.   Neurological: Negative.   Psychiatric/Behavioral: Negative.     Blood pressure (!) 151/92, pulse 87, temperature 98.4 F (36.9 C), temperature source Oral, resp. rate 18, height 5' 5.75" (1.67 m), weight 96.6 kg, SpO2 99 %.Body mass index is 34.64 kg/m.  General Appearance: Casual  Eye Contact:  Fair  Speech:  Slow  Volume:  Decreased  Mood:  Euthymic  Affect:  Inappropriate  Thought Process:  Disorganized  Orientation:  Full (Time, Place, and Person)  Thought Content:  Illogical  Suicidal Thoughts:  No  Homicidal Thoughts:  No  Memory:   Immediate;   Fair Recent;   Fair Remote;   Fair  Judgement:  Impaired  Insight:  Shallow  Psychomotor Activity:  Decreased  Concentration:  Concentration: Poor  Recall:  [employer] of Knowledge:  Fair  Language:  Fair  Akathisia:  No  Handed:  Right  AIMS (if indicated):     Assets:  Desire for Improvement Housing Physical Health  ADL's:  Impaired  Cognition:  Impaired,  Mild  Sleep:  Number of Hours: 7     Treatment Plan Summary: Daily contact with patient to assess and evaluate symptoms and progress in treatment, Medication management and Plan Patient tolerating Zyprexa.  Continue current medicine which was what helped him the last time he was here.  Explained medication protocol to the patient.  Explained how we were trying to get him back to thinking and behaving normally so that he could go home.  No change to medication or treatment.  Engage in individual and group therapy.  Hope for length of stay maybe 2 to 3 days.  Mordecai Rasmussen, MD 04/16/2018, 4:33 PM

## 2018-04-16 NOTE — Progress Notes (Signed)
Recreation Therapy Notes  INPATIENT RECREATION THERAPY ASSESSMENT  Patient Details Name: LONDELL MADRIL MRN: [id number] DOB: 1980-11-29 Today's Date: 04/16/2018       Information Obtained From: Chart Review  Able to Participate in Assessment/Interview:    Patient Presentation:    Reason for Admission (Per Patient): Active Symptoms  Patient Stressors:    Coping Skills:   Talk  Leisure Interests (2+):  Social - Family  Frequency of Recreation/Participation: Monthly  Awareness of Community Resources:  Yes  Community Resources:  Other (Comment)([facility])  Current Use:    If no, Barriers?:    Expressed Interest in [employer] Information:    [place] of Residence:  [place]  Patient Main Form of Transportation: Other (Comment)(Wife)  Patient Strengths:  Listening and comprehending thigns  Patient Identified Areas of Improvement:  My happiness  Patient Goal for Hospitalization:  To be happy  Current SI (including self-harm):  No  Current HI:  No  Current AVH: No  Staff Intervention Plan: Group Attendance, Collaborate with Interdisciplinary Treatment Team  Consent to Intern Participation: N/A  Trenia Tennyson 04/16/2018, 2:29 PM

## 2018-04-16 NOTE — Progress Notes (Signed)
Recreation Therapy Notes    Date: 04/16/2018  Time: 9:30 am  Location: Craft Room  Behavioral response: Appropriate  Intervention Topic: Self-esteem  Discussion/Intervention:  Group content today was focused on self-esteem. Patient defined self-esteem and where it comes form. The group described reasons self-esteem is important. Individuals stated things that impact self-esteem and positive ways to improve self-esteem. The group participated in the intervention "Collage of Me" where patients were able to create a collage of positive things that makes them who they are. Clinical Observations/Feedback:  Patient came to group but was pulled out by peer support. Individual never returned to group.   Tyler Watts LRT/CTRS         Mariah Harn 04/16/2018 11:20 AM

## 2018-04-16 NOTE — Plan of Care (Signed)
Pt is complaint with treatment and unit procedures. Pt. Up for meals and groups, but otherwise mostly isolative. Pt. Denies si/hi/avh, can contract for safety. Pt. Endorses a normal mood, just expresses he is tired today.    Problem: Activity: Goal: Interest or engagement in leisure activities will improve 04/16/2018 1211 by Lenox Ponds, RN Outcome: Progressing 04/16/2018 1209 by Lenox Ponds, RN Outcome: Not Progressing   Problem: Education: Goal: Emotional status will improve 04/16/2018 1211 by Lenox Ponds, RN Outcome: Progressing 04/16/2018 1209 by Lenox Ponds, RN Outcome: Progressing Goal: Mental status will improve 04/16/2018 1211 by Lenox Ponds, RN Outcome: Progressing 04/16/2018 1209 by Lenox Ponds, RN Outcome: Progressing   Problem: Activity: Goal: Interest or engagement in activities will improve Outcome: Progressing   Problem: Health Behavior/Discharge Planning: Goal: Compliance with treatment plan for underlying cause of condition will improve 04/16/2018 1211 by Lenox Ponds, RN Outcome: Progressing 04/16/2018 1209 by Lenox Ponds, RN Outcome: Progressing   Problem: Safety: Goal: Periods of time without injury will increase 04/16/2018 1211 by Lenox Ponds, RN Outcome: Progressing 04/16/2018 1209 by Lenox Ponds, RN Outcome: Progressing

## 2018-04-16 NOTE — Tx Team (Addendum)
Interdisciplinary Treatment and Diagnostic Plan Update  04/16/2018 Time of Session: 230PM [name] MRN: [id number]  Principal Diagnosis: Bipolar I disorder, current or most recent episode manic, with psychotic features [facility])  Secondary Diagnoses: Principal Problem:   Bipolar I disorder, current or most recent episode manic, with psychotic features (HCC)   Current Medications:  Current Facility-Administered Medications  Medication Dose Route Frequency Provider Last Rate Last Dose  . acetaminophen (TYLENOL) tablet 650 mg  650 mg Oral Q6H PRN [name], MD      . alum & mag hydroxide-simeth (MAALOX/MYLANTA) 200-200-20 MG/5ML suspension 30 mL  30 mL Oral Q4H PRN [name], MD      . hydrOXYzine (ATARAX/VISTARIL) tablet 25 mg  25 mg Oral TID PRN [name], MD      . OLANZapine zydis (ZYPREXA) disintegrating tablet 10 mg  10 mg Oral Q8H PRN [name], MD       And  . LORazepam (ATIVAN) tablet 1 mg  1 mg Oral PRN [name], MD       And  . ziprasidone (GEODON) injection 20 mg  20 mg Intramuscular PRN [name], MD      . magnesium hydroxide (MILK OF MAGNESIA) suspension 30 mL  30 mL Oral Daily PRN [name], MD      . OLANZapine zydis [facility]) disintegrating tablet 15 mg  15 mg Oral QHS [name], [name], MD   15 mg at 04/15/18 2200   PTA Medications: Medications Prior to Admission  Medication Sig Dispense Refill Last Dose  . metoprolol tartrate (LOPRESSOR) 25 MG tablet Take 0.5 tablets (12.5 mg total) by mouth 2 (two) times daily. 60 tablet 1   . OLANZapine (ZYPREXA) 15 MG tablet Take 1 tablet (15 mg total) by mouth at bedtime. 30 tablet 1     Patient Stressors: Financial difficulties Loss of grandmother   Patient Strengths: Ability for insight Capable of independent living Motivation for treatment/growth Supportive family/friends  Treatment Modalities: Medication Management, Group therapy, Case management,  1 to 1 session with  clinician, Psychoeducation, Recreational therapy.   Physician Treatment Plan for Primary Diagnosis: Bipolar I disorder, current or most recent episode manic, with psychotic features (HCC) Long Term Goal(s): Improvement in symptoms so as ready for discharge Improvement in symptoms so as ready for discharge   Short Term Goals: Ability to demonstrate self-control will improve Ability to identify and develop effective coping behaviors will improve Compliance with prescribed medications will improve  Medication Management: Evaluate patient's response, side effects, and tolerance of medication regimen.  Therapeutic Interventions: 1 to 1 sessions, Unit Group sessions and Medication administration.  Evaluation of Outcomes: Progressing  Physician Treatment Plan for Secondary Diagnosis: Principal Problem:   Bipolar I disorder, current or most recent episode manic, with psychotic features (HCC)  Long Term Goal(s): Improvement in symptoms so as ready for discharge Improvement in symptoms so as ready for discharge   Short Term Goals: Ability to demonstrate self-control will improve Ability to identify and develop effective coping behaviors will improve Compliance with prescribed medications will improve     Medication Management: Evaluate patient's response, side effects, and tolerance of medication regimen.  Therapeutic Interventions: 1 to 1 sessions, Unit Group sessions and Medication administration.  Evaluation of Outcomes: Progressing   RN Treatment Plan for Primary Diagnosis: Bipolar I disorder, current or most recent episode manic, with psychotic features (HCC) Long Term Goal(s): Knowledge of disease and therapeutic regimen to maintain health will  improve  Short Term Goals: Ability to demonstrate self-control, Ability to participate in decision making will improve and Ability to identify and develop effective coping behaviors will improve  Medication Management: RN will administer  medications as ordered by provider, will assess and evaluate patient's response and provide education to patient for prescribed medication. RN will report any adverse and/or side effects to prescribing provider.  Therapeutic Interventions: 1 on 1 counseling sessions, Psychoeducation, Medication administration, Evaluate responses to treatment, Monitor vital signs and CBGs as ordered, Perform/monitor CIWA, COWS, AIMS and Fall Risk screenings as ordered, Perform wound care treatments as ordered.  Evaluation of Outcomes: Progressing   LCSW Treatment Plan for Primary Diagnosis: Bipolar I disorder, current or most recent episode manic, with psychotic features (HCC) Long Term Goal(s): Safe transition to appropriate next level of care at discharge, Engage patient in therapeutic group addressing interpersonal concerns.  Short Term Goals: Engage patient in aftercare planning with referrals and resources, Increase ability to appropriately verbalize feelings, Facilitate acceptance of mental health diagnosis and concerns and Increase skills for wellness and recovery  Therapeutic Interventions: Assess for all discharge needs, 1 to 1 time with Social worker, Explore available resources and support systems, Assess for adequacy in community support network, Educate family and significant other(s) on suicide prevention, Complete Psychosocial Assessment, Interpersonal group therapy.  Evaluation of Outcomes: Progressing   Progress in Treatment: Attending groups: Yes. Participating in groups: Yes. Taking medication as prescribed: Yes. Toleration medication: Yes. Family/Significant other contact made: Yes, individual(s) contacted:  Pts wife Patient understands diagnosis: Yes. Discussing patient identified problems/goals with staff: Yes. Medical problems stabilized or resolved: Yes. Denies suicidal/homicidal ideation: Yes. Issues/concerns per patient self-inventory: No. Other: n/a  New problem(s) identified:  No, Describe:  none  New Short Term/Long Term Goal(s): medication management for mood stabilization;  development of comprehensive mental wellness/sobriety plan.   Patient Goals:  "To feel better about self and lighten up a bit"  Discharge Plan or Barriers: SPE pamphlet, Mobile Crisis information, and AA/NA information provided to patient for additional community support and resources. Pt has an appointment with [facility] on 04/18/2018 at 12:30PM.  Reason for Continuation of Hospitalization: Medication stabilization  Estimated Length of Stay: 5-7 days   Recreational Therapy: Patient Stressors: N/A Patient Goal: Patient will engage in interactions with peers and staff in pro-social manner at least 2x within 5 recreation therapy group sessions  Attendees: Patient: Tyler Watts 04/16/2018 3:10 PM  Physician: Dr. Toni Amend MD 04/16/2018 3:10 PM  Nursing:  04/16/2018 3:10 PM  RN Care Manager: 04/16/2018 3:10 PM  Social Worker: Zollie Scale Moton LCSW 04/16/2018 3:10 PM  Recreational Therapist: Garret Reddish CTRS LRT 04/16/2018 3:10 PM  Other: Penni Homans LCSW 04/16/2018 3:10 PM  Other: Lowella Dandy LCSW 04/16/2018 3:10 PM  Other: 04/16/2018 3:10 PM    Scribe for Treatment Team: Charlann Lange Moton, LCSW 04/16/2018 3:10 PM

## 2018-04-16 NOTE — Progress Notes (Signed)
D: Pt during assessments denies SI/HI/AVH, can contract for safety. Pt is pleasant and cooperative, engages minimally though, forwarding little. Pt. Complains of being just tired today. Pt. Endorses a normal mood. Pt. Presentation is still a bit bizarre. Pt. Affect is flat and a little preoccupied. Pt. Insight described is poor.   A: Q x 15 minute observation checks were completed for safety. Patient was provided with education. Patient was given/offered medications per orders. Patient  was encourage to attend groups, participate in unit activities and continue with plan of care. Pt. Chart and plans of care reviewed. Pt. Given support and encouragement.   R: Patient is complaint with medication and unit procedures. Pt. Goes to groups. Pt. Eating good, attends meals. Pt. Out more visible in the milieu in the evening. Pt. Isolative in the mornings.              Precautionary checks every 15 minutes for safety maintained, room free of safety hazards, patient sustains no injury or falls during this shift. Will endorse care to next shift.

## 2018-04-16 NOTE — [facility] Group Notes (Signed)
LCSW Group Therapy Note  04/16/2018 1:46 PM  Type of Therapy/Topic:  Group Therapy:  Emotion Regulation  Participation Level:  Active   Description of Group:   The purpose of this group is to assist patients in learning to regulate negative emotions and experience positive emotions. Patients will be guided to discuss ways in which they have been vulnerable to their negative emotions. These vulnerabilities will be juxtaposed with experiences of positive emotions or situations, and patients will be challenged to use positive emotions to combat negative ones. Special emphasis will be placed on coping with negative emotions in conflict situations, and patients will process healthy conflict resolution skills.  Therapeutic Goals: 1. Patient will identify two positive emotions or experiences to reflect on in order to balance out negative emotions 2. Patient will label two or more emotions that they find the most difficult to experience 3. Patient will demonstrate positive conflict resolution skills through discussion and/or role plays  Summary of Patient Progress: Pt was respectful and appropriate in group. Pt reported that he was happy and confused before coming to the hospital. Pt reported that since being in the hospital he has been feeling tired and not getting enough rest. Pt often stared with a grin on his face while sitting in group.   Therapeutic Modalities:   Cognitive Behavioral Therapy Feelings Identification Dialectical Behavioral Therapy   Iris Pert, MSW, LCSW Clinical Social Work 04/16/2018 1:46 PM

## 2018-04-17 MED ORDER — OLANZAPINE 5 MG PO TBDP
10.0000 mg | ORAL_TABLET | Freq: Every day | ORAL | Status: DC
  Administered 2018-04-17: 10 mg via ORAL
  Filled 2018-04-17: qty 2

## 2018-04-17 NOTE — [facility] Group Notes (Signed)
LCSW Group Therapy Note  04/17/2018 1:00 PM  Type of Therapy/Topic:  Group Therapy:  Balance in Life  Participation Level:  Active  Description of Group:    This group will address the concept of balance and how it feels and looks when one is unbalanced. Patients will be encouraged to process areas in their lives that are out of balance and identify reasons for remaining unbalanced. Facilitators will guide patients in utilizing problem-solving interventions to address and correct the stressor making their life unbalanced. Understanding and applying boundaries will be explored and addressed for obtaining and maintaining a balanced life. Patients will be encouraged to explore ways to assertively make their unbalanced needs known to significant others in their lives, using other group members and facilitator for support and feedback.  Therapeutic Goals: 1. Patient will identify two or more emotions or situations they have that consume much of in their lives. 2. Patient will identify signs/triggers that life has become out of balance:  3. Patient will identify two ways to set boundaries in order to achieve balance in their lives:  4. Patient will demonstrate ability to communicate their needs through discussion and/or role plays  Summary of Patient Progress: Patient was present and attentive in group.  Patient engaged in discussion on how feeling unbalanced feels like "stress, worry and being unhappy".  Patient was supportive of other group members.  Patient engaged in discussion on boundaries and shared how he has struggled with establishing boundaries with others.    Therapeutic Modalities:   Cognitive Behavioral Therapy Solution-Focused Therapy Assertiveness Training  Penni Homans MSW, LCSW 04/17/2018 1:45 PM

## 2018-04-17 NOTE — Progress Notes (Signed)
D - Patient was in his room upon arrival to the unit. Patient was pleasant during assessment and medication administration. Patient denies SI/HI/AVH, pain, anxiety and depression. Patient stated to this writer, "I have no anxiety, I am pretty relaxed most of the time. I feel I am ready to get out of here and I hope its soon."   A - Patient compliant with medication administration per MD orders and procedures on the unit. Patient given education. Patient given support and encouragement to be active in his treatment plan. Patient informed to let staff know if there are any issues or problems on the unit.   R - Patient being monitored Q 15 minutes for safety per unit protocol. Patient remains safe on the unit.

## 2018-04-17 NOTE — Plan of Care (Signed)
Patient stated to this writer today that he has no anxiety and that he feels like he is ready to get out of here.   Problem: Self-Concept: Goal: Level of anxiety will decrease Outcome: Progressing   Problem: Education: Goal: Emotional status will improve Outcome: Progressing

## 2018-04-17 NOTE — Plan of Care (Signed)
Patient is alert and oriented denies SI, HI and AVH. Patient is pleasant on the unit, stated he was able to rest last night, patient denies pain. Patient dress appropriately, no self harming behaviors observed. No observations of responding to internal stimuli observed. 15 minute safety checks to continue, and encouragement to attend groups. Problem: Activity: Goal: Interest or engagement in leisure activities will improve Outcome: Progressing   Problem: Coping: Goal: Will verbalize feelings Outcome: Progressing   Problem: Role Relationship: Goal: Will demonstrate positive changes in social behaviors and relationships Outcome: Progressing   Problem: Self-Concept: Goal: Level of anxiety will decrease Outcome: Progressing   Problem: Education: Goal: Emotional status will improve Outcome: Progressing

## 2018-04-17 NOTE — Progress Notes (Signed)
Recreation Therapy Notes   Date: 04/17/2018  Time: 9:30 am  Location: Craft Room  Behavioral response: Appropriate  Intervention Topic: Goals   Discussion/Intervention:  Group content on today was focused on goals. Patients described what goals are and how they define goals. Individuals expressed how they go about setting goals and reaching them. The group identified how important goals are and if they make short term goals to reach long term goals. Patients described how many goals they work on at a time and what affects them not reaching their goal. Individuals described how much time they put into planning and obtaining their goals. The group participated in the intervention "My Goal Board" and made personal goal boards to help them achieve their goal. Clinical Observations/Feedback:  Patient came to group and defined goals as something that you have to work for in order to achieve. He explained that he makes smart goals so the he is not setting goals to high. Participant expressed that his goal is to get home and and think things through and be optimistic. Patient stated that the reason he makes goals is to stay on track.  [employer] LRT/CTRS          Kynzlee Hucker 04/17/2018 10:44 AM

## 2018-04-17 NOTE — Progress Notes (Signed)
Tyler Watts  04/17/2018 10:54 AM Tyler Watts  MRN:  [id number] Subjective: Patient states that he feels "a little sluggish.  "He states he slept okay and his mood is good he is not able to explain what happened to him, he states he was not feeling like himself is feeling overwhelmed.  He states he remembered having similar episode back in June but he does not remember what happened to him and states he did not take any medication after discharge home or go for any follow-up.  He states he realized he was having a problem when his wife pointed out to him he did not know what was happening.  He thinks his mind might of been racing but he really cannot remember what happened to him.  He is rather dazed affect seems surprisingly unconcerned, low insight.   Call to wife Tyler Watts: He just called me for first time last night, sounded normal a little tired. But this morning he called and said he got some sleep. She will come visit she doesn't have any help until babysitter tonight. Exactly like last June, but caught it this time, didn't sleep Saturday much, no sleep Sunday night so took him to hospital. Family has put him through a lot, father died when age 36, mother ignored what happened, 11/28/18paternal grandmother died and that was his last link to his dad, thinks it started then, cried, all the stuff he hasn't dealt with his whole life hit him like ton of bricks. His mother cheated on his father, kicked his dad out when dying of brain tumor and her boyfriend moved in with [name] there. Mother over bearing now, a little crazy, last time this happened wanted him to move in with her. This time called [name] yesterday every 15 minutes wanting me to bring him clothes, couldn't because didn't have anyone to help with children, but mother claimed couldn't bring the clothes because care broken, but found out she then went to [employer] drove herself, errand for herself. His brother was drinking  so couldn't bring the clothes. Doesn't have people in his life to help except wife. They think this is all because someone wouldn't pay him for a job he did, but wife thinks he is bipolar and needs therapy. Hoards money, rather family suffer than spend money. Usually level headed, doesn't get mad or upset.  Discussed getting follow up this time. Discussed Zyprexa good medication to start with but other options preferable longer term due to metabolic risks, so needs follow up.   Principal Problem: Bipolar I disorder, current or most recent episode manic, with psychotic features (HCC) Diagnosis: Principal Problem:   Bipolar I disorder, current or most recent episode manic, with psychotic features (HCC)  Total Time spent with patient: 30 minutes  Past Psychiatric History: Patient has a history of a prior episode several months ago with no interval follow-up  Past Medical History:  Past Medical History:  Diagnosis Date  . Psoriasis of scalp    History reviewed. No pertinent surgical history. Family History: History reviewed. No pertinent family history. Family Psychiatric  History: None Social History:  Social History   Substance and Sexual Activity  Alcohol Use No   Comment: 2 beers in a year     Social History   Substance and Sexual Activity  Drug Use Not Currently    Social History   Socioeconomic History  . Marital status: Married    Spouse name: Not on file  .  Number of children: Not on file  . Years of education: Not on file  . Highest education level: Not on file  Occupational History  . Not on file  Social Needs  . Financial resource strain: Not on file  . Food insecurity:    Worry: Not on file    Inability: Not on file  . Transportation needs:    Medical: Not on file    Non-medical: Not on file  Tobacco Use  . Smoking status: Former Smoker    Last attempt to quit: 2017    Years since quitting: 3.1  . Smokeless tobacco: Never Used  Substance and Sexual Activity   . Alcohol use: No    Comment: 2 beers in a year  . Drug use: Not Currently  . Sexual activity: Yes  Lifestyle  . Physical activity:    Days per week: Not on file    Minutes per session: Not on file  . Stress: Not on file  Relationships  . Social connections:    Talks on phone: Not on file    Gets together: Not on file    Attends religious service: Not on file    Active member of club or organization: Not on file    Attends meetings of clubs or organizations: Not on file    Relationship status: Not on file  Other Topics Concern  . Not on file  Social History Narrative  . Not on file   Additional Social History:                         Sleep: Fair  Appetite:  Fair  Current Medications: Current Facility-Administered Medications  Medication Dose Route Frequency Provider Last Rate Last Dose  . acetaminophen (TYLENOL) tablet 650 mg  650 mg Oral Q6H PRN [name], MD      . alum & mag hydroxide-simeth (MAALOX/MYLANTA) 200-200-20 MG/5ML suspension 30 mL  30 mL Oral Q4H PRN [name], MD      . hydrOXYzine (ATARAX/VISTARIL) tablet 25 mg  25 mg Oral TID PRN [name], MD      . OLANZapine zydis (ZYPREXA) disintegrating tablet 10 mg  10 mg Oral Q8H PRN [name], MD       And  . LORazepam (ATIVAN) tablet 1 mg  1 mg Oral PRN [name], MD       And  . ziprasidone (GEODON) injection 20 mg  20 mg Intramuscular PRN [name], MD      . magnesium hydroxide (MILK OF MAGNESIA) suspension 30 mL  30 mL Oral Daily PRN [name], MD      . OLANZapine zydis [facility]) disintegrating tablet 15 mg  15 mg Oral QHS [name], Jackquline Denmark, MD   15 mg at 04/16/18 2151    Lab Results:  No results found for this or any previous visit (from the past 48 hour(s)).  Blood Alcohol level:  Lab Results  Component Value Date   ETH <10 04/14/2018   ETH <10 07/15/2017    Metabolic Disorder Labs: Lab Results  Component Value Date   HGBA1C 4.8  04/14/2018   MPG 91.06 04/14/2018   MPG 93.93 07/17/2017   No results found for: PROLACTIN Lab Results  Component Value Date   CHOL 261 (H) 04/14/2018   TRIG 138 04/14/2018   HDL 42 04/14/2018   CHOLHDL 6.2 04/14/2018   VLDL 28 04/14/2018   LDLCALC 191 (H)  04/14/2018   LDLCALC 109 (H) 07/17/2017    Physical Findings: AIMS:  , ,  ,  ,    CIWA:    COWS:     Musculoskeletal: Strength & Muscle Tone: within normal limits Gait & Station: normal Patient leans: N/A  Psychiatric Specialty Exam: Physical Exam  Constitutional: He appears well-developed and well-nourished.  HENT:  Head: Normocephalic and atraumatic.  Eyes: Pupils are equal, round, and reactive to light. Conjunctivae are normal.  Neck: Normal range of motion.  Cardiovascular: Normal heart sounds.  Respiratory: Effort normal.  GI: Soft.  Musculoskeletal: Normal range of motion.  Neurological: He is alert.  Skin: Skin is warm and dry.  Psychiatric: His affect is blunt and inappropriate. His speech is delayed. He is withdrawn. Cognition and memory are impaired. He expresses inappropriate judgment. He expresses no homicidal and no suicidal ideation.    Review of Systems  Constitutional: Negative.   HENT: Negative.   Eyes: Negative.   Respiratory: Negative.   Cardiovascular: Negative.   Gastrointestinal: Negative.   Musculoskeletal: Negative.   Skin: Negative.   Neurological: Negative.   Psychiatric/Behavioral: Negative.     Blood pressure (!) 147/83, pulse 79, temperature 98.2 F (36.8 C), temperature source Oral, resp. rate 18, height 5' 5.75" (1.67 m), weight 96.6 kg, SpO2 99 %.Body mass index is 34.64 kg/m.  General Appearance: Casual  Eye Contact:  Fair  Speech:  Slow  Volume:  Decreased  Mood:  Euthymic  Affect:  Inappropriate  Thought Process:  Disorganized  Orientation:  Full (Time, Place, and Person)  Thought Content:  Illogical  Suicidal Thoughts:  No  Homicidal Thoughts:  No  Memory:   Immediate;   Fair Recent;   Fair Remote;   Fair  Judgement:  Impaired  Insight:  Shallow  Psychomotor Activity:  Decreased  Concentration:  Concentration: Poor  Recall:  [employer] of Knowledge:  Fair  Language:  Fair  Akathisia:  No  Handed:  Right  AIMS (if indicated):     Assets:  Desire for Improvement Housing Physical Health  ADL's:  Impaired  Cognition:  Impaired,  Mild  Sleep:  Number of Hours: 6.75     Treatment Plan Summary: Daily contact with patient to assess and evaluate symptoms and progress in treatment, Medication management and Plan Continue Zyprexa, taper to 10mg  seems seems a bit sluggish. Wife to visit tonight, see if she can manage him at home, then we will consider discharge 1-2 days hoepfully.   Terance Hart, MD 04/17/2018, 10:54 AM

## 2018-04-18 MED ORDER — OLANZAPINE 10 MG PO TBDP: 10.0000 mg | ORAL_TABLET | Freq: Every day | ORAL | 1 refills | Status: DC

## 2018-04-18 MED ORDER — OLANZAPINE 10 MG PO TABS: 10.0000 mg | ORAL_TABLET | Freq: Every day | ORAL | Status: DC

## 2018-04-18 MED ORDER — OLANZAPINE 10 MG PO TABS: 10.0000 mg | ORAL_TABLET | Freq: Every day | ORAL | 1 refills | Status: AC

## 2018-04-18 NOTE — Plan of Care (Signed)
D- Patient alert and oriented. Patient presents in a pleasant mood on assessment stating that he slept good last night and had no major complaints to voice to this Clinical research associate. Patient denies any signs/symptoms of depression and anxiety. Patient also denies SI, HI, AVH, and pain to this Clinical research associate. Patient's goal for today is "going home", in which he will "stay positive" in order to achieve this goal.  A- Support and encouragement provided.  Routine safety checks conducted every 15 minutes. Patient informed to notify staff with problems or concerns.  R- Patient compliant with medications and treatment plan. Patient receptive, calm, and cooperative. Patient interacts well with others on the unit.  Patient remains safe at this time.   Problem: Activity: Goal: Interest or engagement in leisure activities will improve Outcome: Progressing   Problem: Coping: Goal: Will verbalize feelings Outcome: Progressing   Problem: Role Relationship: Goal: Will demonstrate positive changes in social behaviors and relationships Outcome: Progressing   Problem: Self-Concept: Goal: Level of anxiety will decrease Outcome: Progressing   Problem: Education: Goal: Knowledge of [facility] Education information/materials will improve Outcome: Progressing Goal: Emotional status will improve Outcome: Progressing Goal: Mental status will improve Outcome: Progressing Goal: Verbalization of understanding the information provided will improve Outcome: Progressing   Problem: Activity: Goal: Interest or engagement in activities will improve Outcome: Progressing Goal: Sleeping patterns will improve Outcome: Progressing   Problem: Coping: Goal: Ability to verbalize frustrations and anger appropriately will improve Outcome: Progressing Goal: Ability to demonstrate self-control will improve Outcome: Progressing   Problem: Health Behavior/Discharge Planning: Goal: Identification of resources available to  assist in meeting health care needs will improve Outcome: Progressing Goal: Compliance with treatment plan for underlying cause of condition will improve Outcome: Progressing   Problem: Physical Regulation: Goal: Ability to maintain clinical measurements within normal limits will improve Outcome: Progressing   Problem: Safety: Goal: Periods of time without injury will increase Outcome: Progressing   Problem: Education: Goal: Ability to state activities that reduce stress will improve Outcome: Progressing   Problem: Coping: Goal: Ability to identify and develop effective coping behavior will improve Outcome: Progressing   Problem: Self-Concept: Goal: Level of anxiety will decrease Outcome: Progressing Goal: Ability to modify response to factors that promote anxiety will improve Outcome: Progressing

## 2018-04-18 NOTE — Plan of Care (Signed)
  Problem: Coping: Goal: Ability to verbalize frustrations and anger appropriately will improve Outcome: Progressing  Patient verbalized frustration over his job and marital issues with wife

## 2018-04-18 NOTE — Progress Notes (Signed)
Patient ID: Tyler Watts, male   DOB: [birth year], 38 y.o.   MRN: [id number]   Discharge Note:  Patient denies SI/HI/AVH at this time. Patient discharge instructions, AVS, prescriptions, and transition record gone over with patient. Patient agrees to comply with medication management, follow-up visit, and outpatient therapy. Patient belongings returned to patient. Patient questions and concerns addressed and answered. Patient ambulatory off unit. Patient discharged to home with his wife.

## 2018-04-18 NOTE — BHH Group Notes (Signed)
Feelings Around Relapse 04/18/2018 1PM  Type of Therapy and Topic:  Group Therapy:  Feelings around Relapse and Recovery  Participation Level:  Minimal   Description of Group:    Patients in this group will discuss emotions they experience before and after a relapse. They will process how experiencing these feelings, or avoidance of experiencing them, relates to having a relapse. Facilitator will guide patients to explore emotions they have related to recovery. Patients will be encouraged to process which emotions are more powerful. They will be guided to discuss the emotional reaction significant others in their lives may have to patients' relapse or recovery. Patients will be assisted in exploring ways to respond to the emotions of others without this contributing to a relapse.  Therapeutic Goals: 1. Patient will identify two or more emotions that lead to a relapse for them 2. Patient will identify two emotions that result when they relapse 3. Patient will identify two emotions related to recovery 4. Patient will demonstrate ability to communicate their needs through discussion and/or role plays   Summary of Patient Progress: Minimal  Participation during today's session. Pt sat quietly but did participate in group icebreaker. Pt respected boundaries during session.    Therapeutic Modalities:   Cognitive Behavioral Therapy Solution-Focused Therapy Assertiveness Training Relapse Prevention Therapy   Suzan Slick, LCSW 04/18/2018 2:15 PM

## 2018-04-18 NOTE — Progress Notes (Signed)
  [facility] Adult Case Management Discharge Plan :  Will you be returning to the same living situation after discharge:  Yes,  home At discharge, do you have transportation home?: Yes,  wife picking him up at 4:30PM Do you have the ability to pay for your medications: Yes,  mental health  Release of information consent forms completed and in the chart;   Patient to Follow up at: Follow-up Information    [facility] Follow up on 04/21/2018.   Why:  Please meet Unk Pinto at [facility] on Monday, March 16th at 7:15AM for peer support services. Thank you! Contact information: [address] [place] [address] [phone]           Next level of care provider has access to [facility] Link:no  Safety Planning and Suicide Prevention discussed: Yes,  SPE completed with pt and pts wife  Have you used any form of tobacco in the last 30 days? (Cigarettes, Smokeless Tobacco, Cigars, and/or Pipes): No  Has patient been referred to the Quitline?: N/A patient is not a smoker  Patient has been referred for addiction treatment: N/A  Mechele Dawley, LCSW 04/18/2018, 1:13 PM

## 2018-04-18 NOTE — Progress Notes (Signed)
Patient alert and oriented x 3 with periods of confusion to situation. Patient denies SI/HI/AVH but noted responding to internal stimuli, he denies command hallucinations. Patient isolates to self,his thoughts are disorganized, and he was noted to have delayed response to during assessment with him. Patient appears less anxious no agitation or pacing this evening , he stayed in bed with eyes closed most of the evening. patient is receptive to staff, took his evening medication regimen. No distress noted will continue to monitor

## 2018-04-18 NOTE — BHH Suicide Risk Assessment (Signed)
[facility] Discharge Suicide Risk Assessment   Principal Problem: Bipolar I disorder, current or most recent episode manic, with psychotic features [facility]) Discharge Diagnoses: Principal Problem:   Bipolar I disorder, current or most recent episode manic, with psychotic features (HCC)   Total Time spent with patient: 35 minutes  Musculoskeletal: Strength & Muscle Tone: within normal limits Gait & Station: normal Patient leans: N/A  Psychiatric Specialty Exam:  Blood pressure 132/74, pulse 66, temperature 98.3 F (36.8 C), temperature source Oral, resp. rate 18, height 5' 5.75" (1.67 m), weight 96.6 kg, SpO2 100 %.Body mass index is 34.64 kg/m.  General Appearance: Casual  Eye Contact:  Good  Speech:  Normal Rate  Volume:  Normal  Mood:  Euthymic  Affect:  Congruent  Thought Process:  Coherent  Orientation:  Full (Time, Place, and Person)  Thought Content:  Logical  Suicidal Thoughts:  No  Homicidal Thoughts:  No  Memory:  Immediate;   Good Recent;   Good Remote;   Good  Judgement:  Fair  Insight:  Fair  Psychomotor Activity:  Normal  Concentration:  Concentration: Good and Attention Span: Good  Recall:  Good  Fund of Knowledge:  Good  Language:  Good  Akathisia:  No  Handed:  Right  AIMS (if indicated):     Assets:  Communication Skills Housing Social Support  ADL's:  Intact  Cognition:  WNL  Sleep:  Number of Hours: 8     Mental Status Per Nursing Assessment::   On Admission:  NA  Demographic Factors:  Male  Loss Factors: NA  Historical Factors: 2nd episode psychosis  Risk Reduction Factors:   Responsible for children under 82 years of age, Sense of responsibility to family, Employed, Living with another person, especially a relative, Positive social support and Positive coping skills or problem solving skills  Continued Clinical Symptoms:  Previous Psychiatric Diagnoses and Treatments  Cognitive Features That Contribute To Risk:  None    Suicide  Risk:  Minimal: No identifiable suicidal ideation.  Patients presenting with no risk factors but with morbid ruminations; may be classified as minimal risk based on the severity of the depressive symptoms  Follow-up Information    [facility] Follow up on 04/21/2018.   Why:  Please meet Unk Pinto at [facility] on Monday, March 16th at 7:15AM for peer support services. Thank you! Contact information: [address] [place] [address] [phone]           Plan Of Care/Follow-up recommendations:  Tests:  repeat Lipid panel with PCP  Terance Hart, MD 04/18/2018, 1:51 PM

## 2018-04-18 NOTE — Progress Notes (Signed)
Recreation Therapy Notes  Date: 04/18/2018  Time: 9:30 am  Location: Craft Room  Behavioral response: Appropriate  Intervention Topic: Relaxation  Discussion/Intervention:  Group content today was focused on relaxation. The group defined relaxation and identified healthy ways to relax. Individuals expressed how much time they spend relaxing. Patients expressed how much their life would be if they did not make time for themselves to relax. The group stated ways they could improve their relaxation techniques in the future.  Individuals participated in the intervention "Time to Relax" where they had a chance to experience different relaxation techniques.  Clinical Observations/Feedback:  Patient came to group and identified boating as an activity he likes to participate in to relax. He explained that relaxing get his mind off things and helps him rest his body.Participant described that if he did not have time to relax thins would just be going very fast. Patient participated in the intervention during group.  Itzamar Traynor LRT/CTRS         Stefano Trulson 04/18/2018 11:57 AM

## 2018-04-18 NOTE — Discharge Summary (Signed)
Physician Discharge Summary Note  Patient:  Tyler Watts is an 38 y.o., male MRN:  [id number] DOB:  1980/09/12 Patient phone:  [phone] (home)  Patient address:   [address] [place] [address],  Total Time spent with patient: 66  Date of Admission:  04/14/2018 Date of Discharge: 04/18/2018   Reason for Admission:  Psychotic episode, possibly bipolar  Principal Problem: Bipolar I disorder, current or most recent episode manic, with psychotic features [facility]) Discharge Diagnoses: Principal Problem:   Bipolar I disorder, current or most recent episode manic, with psychotic features [facility])   Past Psychiatric History: First psychotic episode June 2019 very brief episode he did not take follow-up treatment  Past Medical History:  Past Medical History:  Diagnosis Date  . Psoriasis of scalp    History reviewed. No pertinent surgical history. Family History: History reviewed. No pertinent family history. 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 Marital status: Married    Spouse name: Not on file  . Number of children: Not on file  . Years of education: Not on file  . Highest education level: Not on file  Occupational History  . Not on file  Social Needs  . Financial resource strain: Not on file  . Food insecurity:    Worry: Not on file    Inability: Not on file  . Transportation needs:    Medical: Not on file    Non-medical: Not on file  Tobacco Use  . Smoking status: Former Smoker    Last attempt to quit: 2017    Years since quitting: 3.1  . Smokeless tobacco: Never Used  Substance and Sexual Activity  . Alcohol use: No    Comment: 2 beers in a year  . Drug use: Not Currently  . Sexual  activity: Yes  Lifestyle  . Physical activity:    Days per week: Not on file    Minutes per session: Not on file  . Stress: Not on file  Relationships  . Social connections:    Talks on phone: Not on file    Gets together: Not on file    Attends religious service: Not on file    Active member of club or organization: Not on file    Attends meetings of clubs or organizations: Not on file    Relationship status: Not on file  Other Topics Concern  . Not on file  Social History Narrative  . Not on file    Hospital Course: Patient is gradually reorganized started sleeping again the bizarre staring behaviors have resolved and he is interacting better.  He does not remember what happened or how he ended up in the hospital, similar to last time he just did not recognize or have insight that this event was occurring.  He states that what ever happened he knows now that he needs follow-up and although he does not want to be on medication long-term he understands my recommendation that he slowly taper and have ongoing follow-ups from a psychiatrist rather than simply stop taking the medication.  Also brought up the idea that if he does taper off that he have a contingency plan to restart medications  promptly should he or his wife notice early warning symptoms again.   Day of discharge 04/18/18: Wife reports that that his mother came before she did last evening, seemed nervous with mother there, calmer after mother left. He did say he missed the kids, didn't repeat himself that much. 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 Mother wants to get him to move in with her. Ongoing issue with the mother.  Physical Findings: AIMS: Negative     CIWA:   NA COWS:    NA Musculoskeletal: Strength & Muscle Tone: within normal limits Gait & Station: normal Patient leans:  N/A  Psychiatric Specialty Exam: Physical Exam  ROS  Blood pressure 132/74, pulse 66, temperature 98.3 F (36.8 C), temperature source Oral, resp. rate 18, height 5' 5.75" (1.67 m), weight 96.6 kg, SpO2 100 %.Body mass index is 34.64 kg/m.  General Appearance: Casual  Eye Contact:  Good  Speech:  Normal Rate  Volume:  Normal  Mood:  Euthymic  Affect:  Congruent  Thought Process:  Coherent  Orientation:  Full (Time, Place, and Person)  Thought Content:  Logical  Suicidal Thoughts:  No  Homicidal Thoughts:  No  Memory:  Immediate;   Good Recent;   Good Remote;   Good  Judgement:  Fair  Insight:  Fair  Psychomotor Activity:  Normal  Concentration:  Concentration: Good and Attention Span: Good  Recall:  Good  Fund of Knowledge:  Good  Language:  Good  Akathisia:  No  Handed:  Right  AIMS (if indicated):     Assets:  Communication Skills Housing Social Support  ADL's:  Intact  Cognition:  WNL  Sleep:  Number of Hours: 8     Have you used any form of tobacco in the last 30 days? (Cigarettes, Smokeless Tobacco, Cigars, and/or Pipes): No  Has this patient used any form of tobacco in the last 30 days? (Cigarettes, Smokeless Tobacco, Cigars, and/or Pipes) No Blood Alcohol level:  Lab Results  Component Value Date   ETH <10 04/14/2018   ETH <10 07/15/2017    Metabolic Disorder Labs:  Lab Results  Component Value Date   HGBA1C 4.8 04/14/2018   MPG 91.06 04/14/2018   MPG 93.93 07/17/2017   No results found for: PROLACTIN Lab Results  Component Value Date   CHOL 261 (H) 04/14/2018   TRIG 138 04/14/2018   HDL 42 04/14/2018   CHOLHDL 6.2 04/14/2018   VLDL 28 04/14/2018   LDLCALC 191 (H) 04/14/2018   LDLCALC 109 (H) 07/17/2017    See Psychiatric Specialty Exam and Suicide Risk Assessment completed by Attending Physician prior to discharge.  Discharge destination:  Home  Is patient on multiple antipsychotic therapies at discharge:  No   Has Patient had three or  more failed trials of antipsychotic monotherapy by history:  No  Recommended Plan for Multiple Antipsychotic Therapies: NA   Allergies as of 04/18/2018   No Known Allergies     Medication List    STOP taking these medications   OLANZapine 15 MG tablet Commonly known as:  ZYPREXA Replaced by:  OLANZapine zydis 10 MG disintegrating tablet     TAKE these medications     Indication  metoprolol tartrate 25 MG tablet Commonly known as:  LOPRESSOR Take 0.5 tablets (12.5 mg total) by mouth 2 (two) times daily.  Indication:  High Blood Pressure Disorder   OLANZapine zydis 10 MG disintegrating tablet Commonly known as:  ZYPREXA Take 1 tablet (10 mg total) by mouth at bedtime for 30 days. Replaces:  OLANZapine 15 MG tablet  Indication:  Hypomanic Episode of Bipolar Disorder      Follow-up Information    [facility] Follow up on 04/21/2018.   Why:  Please meet Unk Pinto at [facility] on Monday, March 16th at 7:15AM for peer support services. Thank you! Contact information: [address] [place] [address] [phone]           Follow-up recommendations:  Tests:  Repeat Lipid panel consider treatment with PCP    Signed: Terance Hart, MD 04/18/2018, 1:39 PM

## 2018-04-18 NOTE — Progress Notes (Signed)
Recreation Therapy Notes  INPATIENT RECREATION TR PLAN  Patient Details Name: Tyler Watts MRN: [id number] DOB: 13-Nov-1980 Today's Date: 04/18/2018  Rec Therapy Plan Is patient appropriate for Therapeutic Recreation?: Yes Treatment times per week: at least 3 Estimated Length of Stay: 5-7 days TR Treatment/Interventions: Group participation (Comment)  Discharge Criteria Pt will be discharged from therapy if:: Discharged Treatment plan/goals/alternatives discussed and agreed upon by:: Patient/family  Discharge Summary Short term goals set: Patient will engage in interactions with peers and staff in pro-social manner at least 2x within 5 recreation therapy group sessions Short term goals met: Complete Progress toward goals comments: Groups attended Which groups?: Other (Comment), Self-esteem, Goal setting(Happiness, Relaxation) Reason goals not met: N/A Therapeutic equipment acquired: N/A Reason patient discharged from therapy: Discharge from hospital Pt/family agrees with progress & goals achieved: Yes Date patient discharged from therapy: 04/18/18   Shamir Tuzzolino 04/18/2018, 2:03 PM
